# Patient Record
Sex: Female | Born: 1970 | Race: Black or African American | Hispanic: No | Marital: Married | Smoking: Never smoker
Health system: Southern US, Community
[De-identification: ages and names within clinical notes are randomized; demographics above are authoritative.]

## PROBLEM LIST (undated history)

## (undated) DIAGNOSIS — I10 Essential (primary) hypertension: Secondary | ICD-10-CM

## (undated) DIAGNOSIS — K219 Gastro-esophageal reflux disease without esophagitis: Secondary | ICD-10-CM

---

## 2007-01-16 ENCOUNTER — Ambulatory Visit: Payer: Self-pay | Admitting: Unknown Physician Specialty

## 2007-01-17 ENCOUNTER — Ambulatory Visit: Payer: Self-pay | Admitting: Unknown Physician Specialty

## 2007-10-21 ENCOUNTER — Ambulatory Visit: Payer: Self-pay | Admitting: Unknown Physician Specialty

## 2012-03-12 ENCOUNTER — Ambulatory Visit: Payer: Self-pay | Admitting: Internal Medicine

## 2013-03-14 ENCOUNTER — Ambulatory Visit: Payer: Self-pay | Admitting: Family Medicine

## 2013-12-30 DIAGNOSIS — R7301 Impaired fasting glucose: Secondary | ICD-10-CM | POA: Insufficient documentation

## 2013-12-30 DIAGNOSIS — K219 Gastro-esophageal reflux disease without esophagitis: Secondary | ICD-10-CM | POA: Insufficient documentation

## 2013-12-30 DIAGNOSIS — E785 Hyperlipidemia, unspecified: Secondary | ICD-10-CM | POA: Insufficient documentation

## 2014-03-16 ENCOUNTER — Ambulatory Visit: Payer: Self-pay | Admitting: Family Medicine

## 2016-02-21 DIAGNOSIS — Z713 Dietary counseling and surveillance: Secondary | ICD-10-CM | POA: Insufficient documentation

## 2016-02-21 DIAGNOSIS — K912 Postsurgical malabsorption, not elsewhere classified: Secondary | ICD-10-CM | POA: Insufficient documentation

## 2016-02-21 DIAGNOSIS — Z9884 Bariatric surgery status: Secondary | ICD-10-CM | POA: Insufficient documentation

## 2016-03-02 ENCOUNTER — Other Ambulatory Visit: Payer: Self-pay | Admitting: Family Medicine

## 2016-03-02 DIAGNOSIS — Z1231 Encounter for screening mammogram for malignant neoplasm of breast: Secondary | ICD-10-CM

## 2016-04-04 ENCOUNTER — Ambulatory Visit
Admission: RE | Admit: 2016-04-04 | Discharge: 2016-04-04 | Disposition: A | Discharge status: Home / Self Care | Payer: BC Managed Care – PPO | Source: Ambulatory Visit | Attending: Family Medicine | Admitting: Family Medicine

## 2016-04-04 ENCOUNTER — Encounter: Payer: Self-pay | Admitting: Radiology

## 2016-04-04 DIAGNOSIS — Z1231 Encounter for screening mammogram for malignant neoplasm of breast: Secondary | ICD-10-CM | POA: Diagnosis present

## 2017-07-03 ENCOUNTER — Other Ambulatory Visit: Payer: Self-pay | Admitting: Family Medicine

## 2017-07-03 DIAGNOSIS — Z1231 Encounter for screening mammogram for malignant neoplasm of breast: Secondary | ICD-10-CM

## 2017-07-04 ENCOUNTER — Ambulatory Visit
Admission: RE | Admit: 2017-07-04 | Discharge: 2017-07-04 | Disposition: A | Discharge status: Home / Self Care | Payer: BC Managed Care – PPO | Source: Ambulatory Visit | Attending: Family Medicine | Admitting: Family Medicine

## 2017-07-04 DIAGNOSIS — Z1231 Encounter for screening mammogram for malignant neoplasm of breast: Secondary | ICD-10-CM | POA: Diagnosis present

## 2018-08-29 ENCOUNTER — Ambulatory Visit
Admission: RE | Admit: 2018-08-29 | Discharge: 2018-08-29 | Disposition: A | Discharge status: Home / Self Care | Payer: BC Managed Care – PPO | Source: Ambulatory Visit | Attending: General Practice | Admitting: General Practice

## 2018-08-29 ENCOUNTER — Other Ambulatory Visit: Payer: Self-pay | Admitting: General Practice

## 2018-08-29 DIAGNOSIS — M545 Low back pain, unspecified: Secondary | ICD-10-CM

## 2018-10-21 ENCOUNTER — Other Ambulatory Visit: Payer: Self-pay | Admitting: Family Medicine

## 2018-10-21 DIAGNOSIS — Z1231 Encounter for screening mammogram for malignant neoplasm of breast: Secondary | ICD-10-CM

## 2018-10-29 ENCOUNTER — Other Ambulatory Visit: Payer: Self-pay | Admitting: *Deleted

## 2018-10-29 DIAGNOSIS — Z20822 Contact with and (suspected) exposure to covid-19: Secondary | ICD-10-CM

## 2018-11-04 LAB — NOVEL CORONAVIRUS, NAA: SARS-CoV-2, NAA: NOT DETECTED

## 2018-12-05 ENCOUNTER — Ambulatory Visit
Admission: RE | Admit: 2018-12-05 | Discharge: 2018-12-05 | Disposition: A | Discharge status: Home / Self Care | Payer: BC Managed Care – PPO | Source: Ambulatory Visit | Attending: Family Medicine | Admitting: Family Medicine

## 2018-12-05 ENCOUNTER — Encounter: Payer: Self-pay | Admitting: Radiology

## 2018-12-05 DIAGNOSIS — Z1231 Encounter for screening mammogram for malignant neoplasm of breast: Secondary | ICD-10-CM | POA: Diagnosis present

## 2019-01-21 HISTORY — PX: BACK SURGERY: SHX140

## 2019-02-07 ENCOUNTER — Other Ambulatory Visit: Payer: Self-pay

## 2019-02-07 ENCOUNTER — Ambulatory Visit: Payer: Self-pay

## 2019-02-07 DIAGNOSIS — Z23 Encounter for immunization: Secondary | ICD-10-CM

## 2019-06-22 ENCOUNTER — Ambulatory Visit: Payer: BC Managed Care – PPO | Attending: Internal Medicine

## 2019-06-22 DIAGNOSIS — Z23 Encounter for immunization: Secondary | ICD-10-CM | POA: Insufficient documentation

## 2019-06-22 NOTE — Progress Notes (Signed)
   Covid-19 Vaccination Clinic  Name:  Tomeshia Pizzi    MRN: [id number] DOB: [birth year]  06/22/2019  Ms. Jackquline Bosch was observed post Covid-19 immunization for 15 minutes without incidence. She was provided with Vaccine Information Sheet and instruction to access the V-Safe system.   Ms. Jackquline Bosch was instructed to call 911 with any severe reactions post vaccine: Marland Kitchen Difficulty breathing  . Swelling of your face and throat  . A fast heartbeat  . A bad rash all over your body  . Dizziness and weakness    Immunizations Administered    Name Date Dose VIS Date Route   Pfizer COVID-19 Vaccine 06/22/2019  2:44 PM 0.3 mL 04/04/2019 Intramuscular   Manufacturer: [employer], [employer]   Lot: [id number]   NDC: [address]-7

## 2019-07-16 ENCOUNTER — Ambulatory Visit: Payer: BC Managed Care – PPO | Attending: Internal Medicine

## 2019-07-16 DIAGNOSIS — Z23 Encounter for immunization: Secondary | ICD-10-CM

## 2019-07-16 NOTE — Progress Notes (Signed)
   Covid-19 Vaccination Clinic  Name:  Gudrun Axe    MRN: [id number] DOB: 12-May-1970  07/16/2019  Ms. Jackquline Bosch was observed post Covid-19 immunization for 15 minutes without incident. She was provided with Vaccine Information Sheet and instruction to access the V-Safe system.   Ms. Jackquline Bosch was instructed to call 911 with any severe reactions post vaccine: Marland Kitchen Difficulty breathing  . Swelling of face and throat  . A fast heartbeat  . A bad rash all over body  . Dizziness and weakness   Immunizations Administered    Name Date Dose VIS Date Route   Pfizer COVID-19 Vaccine 07/16/2019  1:52 PM 0.3 mL 04/04/2019 Intramuscular   Manufacturer: [employer], [employer]   Lot: [id number]   NDC: [address]-1

## 2019-09-12 ENCOUNTER — Emergency Department
Admission: EM | Admit: 2019-09-12 | Discharge: 2019-09-12 | Disposition: A | Discharge status: Home / Self Care | Payer: BC Managed Care – PPO | Attending: Emergency Medicine | Admitting: Emergency Medicine

## 2019-09-12 ENCOUNTER — Emergency Department: Payer: BC Managed Care – PPO

## 2019-09-12 DIAGNOSIS — M79605 Pain in left leg: Secondary | ICD-10-CM | POA: Insufficient documentation

## 2019-09-12 DIAGNOSIS — R110 Nausea: Secondary | ICD-10-CM | POA: Insufficient documentation

## 2019-09-12 DIAGNOSIS — R079 Chest pain, unspecified: Secondary | ICD-10-CM

## 2019-09-12 DIAGNOSIS — M79604 Pain in right leg: Secondary | ICD-10-CM | POA: Insufficient documentation

## 2019-09-12 DIAGNOSIS — R0789 Other chest pain: Secondary | ICD-10-CM | POA: Diagnosis not present

## 2019-09-12 DIAGNOSIS — Z9884 Bariatric surgery status: Secondary | ICD-10-CM | POA: Insufficient documentation

## 2019-09-12 LAB — CBC
HCT: 36.5 % (ref 36.0–46.0)
Hemoglobin: 13 g/dL (ref 12.0–15.0)
MCH: 31.6 pg (ref 26.0–34.0)
MCHC: 35.6 g/dL (ref 30.0–36.0)
MCV: 88.6 fL (ref 80.0–100.0)
Platelets: 258 10*3/uL (ref 150–400)
RBC: 4.12 MIL/uL (ref 3.87–5.11)
RDW: 12.8 % (ref 11.5–15.5)
WBC: 8.3 10*3/uL (ref 4.0–10.5)
nRBC: 0 % (ref 0.0–0.2)

## 2019-09-12 LAB — BASIC METABOLIC PANEL
Anion gap: 8 (ref 5–15)
BUN: 9 mg/dL (ref 6–20)
CO2: 29 mmol/L (ref 22–32)
Calcium: 8.6 mg/dL — ABNORMAL LOW (ref 8.9–10.3)
Chloride: 101 mmol/L (ref 98–111)
Creatinine, Ser: 0.69 mg/dL (ref 0.44–1.00)
GFR calc Af Amer: >60 mL/min (ref >60)
GFR calc non Af Amer: >60 mL/min (ref >60)
Glucose, Bld: 101 mg/dL — ABNORMAL HIGH (ref 70–99)
Potassium: 3.1 mmol/L — ABNORMAL LOW (ref 3.5–5.1)
Sodium: 138 mmol/L (ref 135–145)

## 2019-09-12 LAB — URINALYSIS, COMPLETE (UACMP) WITH MICROSCOPIC
Bacteria, UA: NONE SEEN
Bilirubin Urine: NEGATIVE
Glucose, UA: NEGATIVE mg/dL
Hgb urine dipstick: NEGATIVE
Ketones, ur: NEGATIVE mg/dL
Leukocytes,Ua: NEGATIVE
Nitrite: NEGATIVE
Protein, ur: NEGATIVE mg/dL
Specific Gravity, Urine: 1.003 — ABNORMAL LOW (ref 1.005–1.030)
Squamous Epithelial / HPF: NONE SEEN (ref 0–5)
pH: 6 (ref 5.0–8.0)

## 2019-09-12 LAB — POCT PREGNANCY, URINE: Preg Test, Ur: NEGATIVE

## 2019-09-12 LAB — TROPONIN I (HIGH SENSITIVITY)
Troponin I (High Sensitivity): <2 ng/L (ref <18)
Troponin I (High Sensitivity): <2 ng/L (ref <18)

## 2019-09-12 LAB — LIPASE, BLOOD: Lipase: 28 U/L (ref 11–51)

## 2019-09-12 MED ORDER — POTASSIUM CHLORIDE CRYS ER 20 MEQ PO TBCR
40.0000 meq | EXTENDED_RELEASE_TABLET | Freq: Once | ORAL | Status: AC
  Administered 2019-09-12: 40 meq via ORAL
  Filled 2019-09-12: qty 2

## 2019-09-12 MED ORDER — ONDANSETRON 4 MG PO TBDP
4.0000 mg | ORAL_TABLET | Freq: Three times a day (TID) | ORAL | 0 refills | Status: DC | PRN
Start: 2019-09-12 — End: 2019-11-03

## 2019-09-12 MED ORDER — ONDANSETRON 4 MG PO TBDP
4.0000 mg | ORAL_TABLET | Freq: Once | ORAL | Status: AC
  Administered 2019-09-12: 4 mg via ORAL
  Filled 2019-09-12: qty 1

## 2019-09-12 NOTE — ED Provider Notes (Signed)
[facility] Emergency Department Provider Note  ____________________________________________   First MD Initiated Contact with Patient 09/12/19 1905     (approximate)  I have reviewed the triage vital signs and the nursing notes.   HISTORY  Chief Complaint Chest Pain and Abdominal Pain    HPI Sharon Padilla is a 49 y.o. female who is otherwise healthy who comes in with multiple concerns.  #1 patient reports having butterflies in her stomach.  Patient reports she has had this for about a month now.  She had a gastric band done years ago but denies any recent complications.  She still able to eat denies any abdominal pain or vomiting.  She does state that she does have a little bit of nausea associated with it.  States that the symptoms are intermittent, nothing makes it better, nothing makes it worse  Patient also reports having some chest pain for about a week now but then today she developed some sharp stabbing pain on the left side of her chest so that made her more concerned so she went to make sure her heart looked good.  She denies any shortness of breath associated with it.  She denies any pain currently She describes occasionally feeling like the butterflies in her stomach caused palpitations and her heart.  Finally patient reports having some bilateral leg pain and cramping sensations.  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  She denies any of the symptoms that she is having as being new but she thought that they would get better after the surgery.  She denies any numbness or tingling, rectal numbness, weakness in the legs, urinating on herself.  Patient still able to ambulate normally          History reviewed. No pertinent past medical history.  There are no problems to display for this patient.   History reviewed. No pertinent surgical history.  Prior to Admission  medications   Not on File    Allergies Hydrocodone  Family History  Problem Relation Age of Onset  . Breast cancer Paternal Grandmother     Social History Social History   Tobacco Use  . Smoking status: Not on file  Substance Use Topics  . Alcohol use: Not on file  . Drug use: Not on file      Review of Systems Constitutional: No fever/chills Eyes: No visual changes. ENT: No sore throat. Cardiovascular: Positive chest pain Respiratory: Denies shortness of breath. Gastrointestinal: No abdominal pain.  Fluttering in abdomen, nausea.  No diarrhea.  No constipation. Genitourinary: Negative for dysuria. Musculoskeletal: Negative for back pain.  Leg pain Skin: Negative for rash. Neurological: Negative for headaches, focal weakness or numbness. All other ROS negative ____________________________________________   PHYSICAL EXAM:  VITAL SIGNS: ED Triage Vitals  Enc Vitals Group     BP 09/12/19 1641 118/73     Pulse Rate 09/12/19 1641 74     Resp 09/12/19 1641 18     Temp 09/12/19 1641 98.5 F (36.9 C)     Temp Source 09/12/19 1641 Oral     SpO2 09/12/19 1641 100 %     Weight 09/12/19 1638 190 lb (86.2 kg)     Height 09/12/19 1638 5\' 6"  (1.676 m)     Head Circumference --      Peak Flow --      Pain Score 09/12/19 1638 8     Pain Loc --  Pain Edu? --      Excl. in GC? --     Constitutional: Alert and oriented. Well appearing and in no acute distress. Eyes: Conjunctivae are normal. EOMI. Head: Atraumatic. Nose: No congestion/rhinnorhea. Mouth/Throat: Mucous membranes are moist.   Neck: No stridor. Trachea Midline. FROM Cardiovascular: Normal rate, regular rhythm. Grossly normal heart sounds.  Good peripheral circulation.  No chest wall tenderness Respiratory: Normal respiratory effort.  No retractions. Lungs CTAB. Gastrointestinal: Soft and nontender. No distention. No abdominal bruits.  Musculoskeletal: No lower extremity tenderness nor edema.  No joint  effusions. Neurologic:  Normal speech and language.  Equal strength in bilateral legs.  Sensation intact bilaterally.  Cranial nerves II to XII are intact.  Ambulate patient she has a normal gait.  Well-healing midline back scar Skin:  Skin is warm, dry and intact. No rash noted. Psychiatric: Mood and affect are normal. Speech and behavior are normal. GU: Deferred   ____________________________________________   LABS (all labs ordered are listed, but only abnormal results are displayed)  Labs Reviewed  BASIC METABOLIC PANEL - Abnormal; Notable for the following components:      Result Value   Potassium 3.1 (*)    Glucose, Bld 101 (*)    Calcium 8.6 (*)    All other components within normal limits  URINALYSIS, COMPLETE (UACMP) WITH MICROSCOPIC - Abnormal; Notable for the following components:   Color, Urine STRAW (*)    APPearance CLEAR (*)    Specific Gravity, Urine 1.003 (*)    All other components within normal limits  CBC  LIPASE, BLOOD  TROPONIN I (HIGH SENSITIVITY)  TROPONIN I (HIGH SENSITIVITY)   ____________________________________________   ED ECG REPORT I, Concha Se, the attending physician, personally viewed and interpreted this ECG.  EKG normal sinus rate of 74, no ST elevation, T wave version V3, normal intervals ____________________________________________  RADIOLOGY Vela Prose, personally viewed and evaluated these images (plain radiographs) as part of my medical decision making, as well as reviewing the written report by the radiologist.  ED MD interpretation: No pneumonia  Official radiology report(s): DG Chest 2 View  Result Date: 09/12/2019 CLINICAL DATA:  Nausea, chest pain EXAM: CHEST - 2 VIEW COMPARISON:  None. FINDINGS: Frontal and lateral views of the chest demonstrate an unremarkable cardiac silhouette. No airspace disease, effusion, or pneumothorax. No acute bony abnormalities. IMPRESSION: 1. No acute intrathoracic process. Electronically  Signed   By: Sharlet Salina M.D.   On: 09/12/2019 17:22    ____________________________________________   PROCEDURES  Procedure(s) performed (including Critical Care):  Procedures   ____________________________________________   INITIAL IMPRESSION / ASSESSMENT AND PLAN / ED COURSE   Sharon Padilla was evaluated in Emergency Department on 09/12/2019 for the symptoms described in the history of present illness. She was evaluated in the context of the global COVID-19 pandemic, which necessitated consideration that the patient might be at risk for infection with the SARS-CoV-2 virus that causes COVID-19. Institutional protocols and algorithms that pertain to the evaluation of patients at risk for COVID-19 are in a state of rapid change based on information released by regulatory bodies including the CDC and federal and state organizations. These policies and algorithms were followed during the patient's care in the ED.    For patient's butterflies in her stomach and nausea.  Her abdomen is soft and nontender she has no signs of acute abdominal process such as cholecystitis, perforation, obstruction.  We discussed CT imaging but given she is having no  pain or vomiting I think we can hold off.  Discussed trialing some Zofran.  For patient's chest pain will get cardiac markers to rule out ACS.  No shortness of breath and low suspicion for PE.  The pain is not pleuritic in nature.  Will give patient follow-up with cardiology given reports of palpitations to discuss Holter monitor.  For the leg pain and cramping sensation she is neuro intact.  No evidence of cord compression.  These have been going on for over a year.  Discussed with patient she should follow-up with her surgeon.  DDx that was also considered d/t potential to cause harm, but was found less likely based on history and physical (as detailed above): -PNA (no fevers, cough but CXR to evaluate) -PNX (reassured with equal b/l  breath sounds, CXR to evaluate) -Symptomatic anemia (will get H&H) -Pulmonary embolism as no sob at rest, not pleuritic in nature, no hypoxia -Aortic Dissection as no tearing pain and no radiation to the mid back, pulses equal -Pericarditis no rub on exam, EKG changes or hx to suggest dx -Tamponade (no notable SOB, tachycardic, hypotensive) -Esophageal rupture (no h/o diffuse vomitting/no crepitus)   Patient's labs are reassuring x-ray was negative  Potassium slightly low.  We will give some oral repletion.  Cardiac markers negative x2.  Recommended giving some Zofran and p.o. challenge the patient states she would really like to just leave now.  Patient has a really reassuring exam and feels comfortable following up as described above.  She understands to return to ER if her symptoms are worsening patient understand that she can restart her PPI in case this could be related to some acid reflux      ____________________________________________   FINAL CLINICAL IMPRESSION(S) / ED DIAGNOSES   Final diagnoses:  Nausea  Chest pain, unspecified type     MEDICATIONS GIVEN DURING THIS VISIT:  Medications  ondansetron (ZOFRAN-ODT) disintegrating tablet 4 mg (has no administration in time range)  potassium chloride SA (KLOR-CON) CR tablet 40 mEq (has no administration in time range)     ED Discharge Orders    None       Note:  This document was prepared using Dragon voice recognition software and may include unintentional dictation errors.   Vanessa [place], MD 09/12/19 2017

## 2019-09-12 NOTE — ED Triage Notes (Signed)
Pt comes POV with "butterflies" in her stomach that won't go away. Pt states it made her nauseous and made her chest start hurting. Pt had surgery on spine for cyst removal recently but states that her legs still hurt intermittently with numbness. Pt restless and appears anxious.

## 2019-09-12 NOTE — Discharge Instructions (Signed)
For your leg pain you should follow this up with your orthopedic doctor.  For your nausea and your butterflies in the stomach.  We could try you on some Zofran.  You should return to ER if develop abdominal pain or vomiting please at that time we may not want to do CT imaging.  You should restart taking your acid reducer PPI  For your chest pain this could be related to your acid reflux to restart your acid reducer.  Given you are having some palpitations associated with it you should follow-up with cardiology.  You should call them to make an appointment.  Return to the ER if you develop worsening chest pain or any other concerns

## 2019-11-03 ENCOUNTER — Ambulatory Visit: Payer: BC Managed Care – PPO | Admitting: Emergency Medicine

## 2019-11-03 ENCOUNTER — Encounter: Payer: Self-pay | Admitting: Emergency Medicine

## 2019-11-03 ENCOUNTER — Other Ambulatory Visit: Payer: Self-pay

## 2019-11-03 VITALS — BP 122/84 | HR 88 | Temp 98.8°F | Resp 16 | Ht 67.0 in | Wt 180.0 lb

## 2019-11-03 DIAGNOSIS — H6983 Other specified disorders of Eustachian tube, bilateral: Secondary | ICD-10-CM

## 2019-11-03 MED ORDER — AZITHROMYCIN 250 MG PO TABS
ORAL_TABLET | ORAL | 0 refills | Status: DC
Start: 2019-11-03 — End: 2019-11-27

## 2019-11-03 MED ORDER — OXYMETAZOLINE HCL 0.05 % NA SOLN: 1.0000 | Freq: Once | NASAL | Status: AC

## 2019-11-03 MED ORDER — BENZONATATE 200 MG PO CAPS
200.0000 mg | ORAL_CAPSULE | Freq: Three times a day (TID) | ORAL | 1 refills | Status: DC | PRN
Start: 2019-11-03 — End: 2019-11-27

## 2019-11-03 MED ORDER — PREDNISONE 10 MG PO TABS: ORAL_TABLET | ORAL | 0 refills | Status: DC

## 2019-11-03 MED ORDER — OMEPRAZOLE MAGNESIUM 20 MG PO TBEC: 20.0000 mg | DELAYED_RELEASE_TABLET | Freq: Every day | ORAL | 2 refills | Status: DC

## 2019-11-03 NOTE — Progress Notes (Signed)
I have reviewed the triage vital signs and the nursing notes.   HISTORY  Chief Complaint Cough  HPI Sharon Padilla is a 49 y.o. female presents with complaint of nonproductive cough for [redacted] weeks along with bilateral ear pain.  Patient denies any fever or chills.  There is been no loss of taste or smell.  Patient initially thought this was due to allergies.  She is concerned as she has a trip planned to go to [place].  She reports that she is unable to get her ears to open which multiples the sound.  Currently she is using Flonase without results.      No past medical history on file.  Patient Active Problem List   Diagnosis Date Noted  . Dietary counseling 02/21/2016  . Hx of laparoscopic gastric banding 02/21/2016  . 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 GERD (gastroesophageal reflux disease) 12/30/2013  . Impaired fasting glucose 12/30/2013  . Other and unspecified hyperlipidemia 12/30/2013    Past Surgical History:  Procedure Laterality Date  . BACK SURGERY  01/21/2019    Prior to Admission medications   Medication Sig Start Date End Date Taking? Authorizing Provider  ACETAMINOPHEN EXTRA STRENGTH 500 MG tablet  07/24/19  Yes [provider]  calcium citrate-vitamin D (CITRACAL+D) 315-200 MG-UNIT tablet Take 1 tablet by mouth daily.   Yes [provider]  fluticasone (FLONASE) 50 MCG/ACT nasal spray Place 2 sprays into both nostrils daily. 08/30/19  Yes [provider]  LORazepam (ATIVAN) 0.5 MG tablet TAKE 1 TABLET(0.5 MG) BY MOUTH EVERY DAY AS NEEDED 10/13/19  Yes [provider]  losartan-hydrochlorothiazide (HYZAAR) 50-12.5 MG tablet Take 1 tablet by mouth as needed. 10/07/18  Yes [provider]  Omega-3 Fat Ac-Cholecalciferol (OCEAN BLUE MINICAPS D/OMEGA3) [phone] MG-UNIT CAPS Take by mouth.   Yes [provider]  omeprazole (PRILOSEC OTC) 20 MG tablet Take 1 tablet (20 mg total) by mouth daily. 11/03/19  Yes  Tommi Rumps, PA-C  valACYclovir (VALTREX) 1000 MG tablet Take 1 tablet by mouth daily as needed. 03/25/19  Yes [provider]  azithromycin (ZITHROMAX) 250 MG tablet As directed 11/03/19   Tommi Rumps, PA-C  benzonatate (TESSALON) 200 MG capsule Take 1 capsule (200 mg total) by mouth every 8 (eight) hours as needed for cough. 11/03/19   Tommi Rumps, PA-C  predniSONE (DELTASONE) 10 MG tablet Take 6 tablets  today, on day 2 take 5 tablets, day 3 take 4 tablets, day 4 take 3 tablets, day 5 take  2 tablets and 1 tablet the last day 11/03/19   Tommi Rumps, PA-C    Allergies Oxycodone  Family History  Problem Relation Age of Onset  . Breast cancer Paternal Grandmother     Social History Social History   Tobacco Use  . Smoking status: Never Smoker  . Smokeless tobacco: Never Used  Substance Use Topics  . Alcohol use: Yes  . Drug use: Never    Review of Systems Constitutional: No fever/chills Eyes: No visual changes. ENT: No sore throat.  Positive ear pain. Cardiovascular: Denies chest pain. Respiratory: Denies shortness of breath.  Positive nonproductive cough. Gastrointestinal: No abdominal pain.  No nausea, no vomiting.  No diarrhea.  Musculoskeletal: Negative for muscle aches. Skin: Negative for rash. Neurological: Negative for headaches, focal weakness or numbness. ____________________________________________   PHYSICAL EXAM: Constitutional: Alert and oriented. Well appearing and in no acute distress.  Dry cough is noted during exam. Eyes: Conjunctivae are normal. PERRL.  EOMI. Head: Atraumatic. Nose: No congestion/rhinnorhea.  EACs are clear.  TMs are dull with poor light reflex.  No erythema or injection is noted. Mouth/Throat: Mucous membranes are moist.  Oropharynx non-erythematous. Neck: No stridor.   Cardiovascular: Normal rate, regular rhythm. Grossly normal heart sounds.  Good peripheral circulation. Respiratory: Normal respiratory  effort.  No retractions. Lungs CTAB. Musculoskeletal: Moves upper and lower extremities without any difficulty.  Normal gait was noted. Neurologic:  Normal speech and language. No gross focal neurologic deficits are appreciated. No gait instability. Skin:  Skin is warm, dry and intact. No rash noted. Psychiatric: Mood and affect are normal. Speech and behavior are normal.  ____________________________________________   LABS (all labs ordered are listed, but only abnormal results are displayed)  No results found.  ____________________________________________   FINAL CLINICAL IMPRESSION(S)   49 year old female presents to the office with complaint of cough for approximately 3 weeks.  There is been no fever or chills.  She denies any symptoms of Covid.  She also complains of ear pain with decreased hearing in each.  On exam findings are consistent with eustachian tube dysfunction with upper respiratory infection.  Because patient will be flying to [facility] we discussed eustachian tube dysfunction.  She will continue using the Flonase that she currently has.  A prescription for prednisone tapering dose and Afrin to be used 30 minutes prior to landing should give her relief of her ear pain.  Also a Z-Pak was prescribed and a refill on her Prilosec was sent to the pharmacy.   ED Discharge Orders         Ordered    OXYMETAZOLINE HCL 0.05 % NA SOLN   Once     Discontinue  Reprint     11/03/19 1424    omeprazole (PRILOSEC OTC) 20 MG tablet  Daily     Discontinue  Reprint     11/03/19 1424    predniSONE (DELTASONE) 10 MG tablet     Discontinue  Reprint     11/03/19 1424    benzonatate (TESSALON) 200 MG capsule  Every 8 hours PRN     Discontinue  Reprint     11/03/19 1424    azithromycin (ZITHROMAX) 250 MG tablet     Discontinue  Reprint     11/03/19 1424           Note:  This document was prepared using Dragon voice recognition software and may include unintentional dictation errors.

## 2019-11-03 NOTE — Progress Notes (Signed)
Pt describes her cough as dry and constant for a couple a weeks. Pt states her throat is starting to get sore but it feels more itchy then anything. Now the left ear is hurting the right ear feels clogged to where it feels like they  Need to be popped. Pt ear started over the past weeknd Friday 7/9. CL,RMA

## 2019-11-26 ENCOUNTER — Encounter: Payer: Self-pay | Admitting: Emergency Medicine

## 2019-11-26 ENCOUNTER — Other Ambulatory Visit: Payer: Self-pay

## 2019-11-26 ENCOUNTER — Ambulatory Visit: Payer: Self-pay | Admitting: Emergency Medicine

## 2019-11-26 VITALS — BP 122/80 | HR 78 | Temp 98.6°F | Resp 16 | Ht 67.0 in

## 2019-11-26 DIAGNOSIS — J020 Streptococcal pharyngitis: Secondary | ICD-10-CM

## 2019-11-26 DIAGNOSIS — R059 Cough, unspecified: Secondary | ICD-10-CM

## 2019-11-26 MED ORDER — CETIRIZINE HCL 10 MG PO TABS: 10.0000 mg | ORAL_TABLET | Freq: Every day | ORAL | 11 refills | Status: AC

## 2019-11-26 MED ORDER — AMOXICILLIN 500 MG PO CAPS
500.0000 mg | ORAL_CAPSULE | Freq: Three times a day (TID) | ORAL | 0 refills | Status: DC
Start: 2019-11-26 — End: 2022-05-02

## 2019-11-26 NOTE — Progress Notes (Signed)
Occupational Health Provider Note       Time seen: 9:15 AM    I have reviewed the vital signs and the nursing notes.  HISTORY   Chief Complaint Follow-up (URI)    HPI Sharon Padilla is a 49 y.o. female with a history of lap band procedure who presents today for thick mucus in her throat.  Patient states she has to cough it up, it seems to be yellow.  She was seen 3 weeks ago and antibiotics and steroids seem to help her sinuses.  She has her symptoms persistently.  Denies any recent changes.  No past medical history on file.  Past Surgical History:  Procedure Laterality Date  . BACK SURGERY  01/21/2019    Allergies Oxycodone   Review of Systems Constitutional: Negative for fever. HEENT exam: Positive for congestion, mucus Cardiovascular: Negative for chest pain. Respiratory: Negative for shortness of breath.  Positive for persistent cough Gastrointestinal: Negative for abdominal pain, vomiting and diarrhea. Musculoskeletal: Negative for back pain. Skin: Negative for rash. Neurological: Negative for headaches, focal weakness or numbness.  All systems negative/normal/unremarkable except as stated in the HPI  ____________________________________________   PHYSICAL EXAM:  VITAL SIGNS: Vitals:   11/26/19 0849  BP: 122/80  Pulse: 78  Resp: 16  Temp: 98.6 F (37 C)  SpO2: 100%    Constitutional: Alert and oriented. Well appearing and in no distress. Eyes: Conjunctivae are normal. Normal extraocular movements. ENT      Head: Normocephalic and atraumatic.      Nose: No congestion/rhinnorhea.      Mouth/Throat: Mucous membranes are moist.  No erythema      Neck: No stridor.  No adenopathy Cardiovascular: Normal rate, regular rhythm. No murmurs, rubs, or gallops. Respiratory: Normal respiratory effort without tachypnea nor retractions. Breath sounds are clear and equal bilaterally. No wheezes/rales/rhonchi. Musculoskeletal: Nontender with normal  range of motion in extremities. No lower extremity tenderness nor edema. Neurologic:  Normal speech and language. No gross focal neurologic deficits are appreciated.  Skin:  Skin is warm, dry and intact. No rash noted.  ____________________________________________   LABS (pertinent positives/negatives)  Recent Results (from the past 2160 hour(s))  Basic metabolic panel     Status: Abnormal   Collection Time: 09/12/19  4:41 PM  Result Value Ref Range   Sodium 138 135 - 145 mmol/L   Potassium 3.1 (L) 3.5 - 5.1 mmol/L   Chloride 101 98 - 111 mmol/L   CO2 29 22 - 32 mmol/L   Glucose, Bld 101 (H) 70 - 99 mg/dL    Comment: Glucose reference range applies only to samples taken after fasting for at least 8 hours.   BUN 9 6 - 20 mg/dL   Creatinine, Ser 2.68 0.44 - 1.00 mg/dL   Calcium 8.6 (L) 8.9 - 10.3 mg/dL   GFR calc non Af Amer >60 >60 mL/min   GFR calc Af Amer >60 >60 mL/min   Anion gap 8 5 - 15    Comment: Performed at [facility], [address] [place], [place] [address]  CBC     Status: None   Collection Time: 09/12/19  4:41 PM  Result Value Ref Range   WBC 8.3 4.0 - 10.5 K/uL   RBC 4.12 3.87 - 5.11 MIL/uL   Hemoglobin 13.0 12.0 - 15.0 g/dL   HCT 22.2 36 - 46 %   MCV 88.6 80.0 - 100.0 fL   MCH 31.6 26.0 - 34.0 pg   MCHC 35.6  30.0 - 36.0 g/dL   RDW 74.1 28.7 - 86.7 %   Platelets 258 150 - 400 K/uL   nRBC 0.0 0.0 - 0.2 %    Comment: Performed at [facility], [address] [place], [place] [address]  Troponin I (High Sensitivity)     Status: None   Collection Time: 09/12/19  4:41 PM  Result Value Ref Range   Troponin I (High Sensitivity) <2 <18 ng/L    Comment: (NOTE) Elevated high sensitivity troponin I (hsTnI) values and significant  changes across serial measurements may suggest ACS but many other  chronic and acute conditions are known to elevate hsTnI results.  Refer to the "Links" section for chest pain algorithms and additional   guidance. Performed at [facility], [address] [place], [place] [address]   Lipase, blood     Status: None   Collection Time: 09/12/19  4:41 PM  Result Value Ref Range   Lipase 28 11 - 51 U/L    Comment: Performed at [facility], [address] [place], [place] [address]  Urinalysis, Complete w Microscopic     Status: Abnormal   Collection Time: 09/12/19  4:42 PM  Result Value Ref Range   Color, Urine STRAW (A) YELLOW   APPearance CLEAR (A) CLEAR   Specific Gravity, Urine 1.003 (L) 1.005 - 1.030   pH 6.0 5.0 - 8.0   Glucose, UA NEGATIVE NEGATIVE mg/dL   Hgb urine dipstick NEGATIVE NEGATIVE   Bilirubin Urine NEGATIVE NEGATIVE   Ketones, ur NEGATIVE NEGATIVE mg/dL   Protein, ur NEGATIVE NEGATIVE mg/dL   Nitrite NEGATIVE NEGATIVE   Leukocytes,Ua NEGATIVE NEGATIVE   RBC / HPF 0-5 0 - 5 RBC/hpf   WBC, UA 0-5 0 - 5 WBC/hpf   Bacteria, UA NONE SEEN NONE SEEN   Squamous Epithelial / LPF NONE SEEN 0 - 5    Comment: Performed at [facility], [address], [place], [place] [address]  Pregnancy, urine POC     Status: None   Collection Time: 09/12/19  4:53 PM  Result Value Ref Range   Preg Test, Ur NEGATIVE NEGATIVE    Comment:        THE SENSITIVITY OF THIS METHODOLOGY IS >24 mIU/mL   Troponin I (High Sensitivity)     Status: None   Collection Time: 09/12/19  6:51 PM  Result Value Ref Range   Troponin I (High Sensitivity) <2 <18 ng/L    Comment: (NOTE) Elevated high sensitivity troponin I (hsTnI) values and significant  changes across serial measurements may suggest ACS but many other  chronic and acute conditions are known to elevate hsTnI results.  Refer to the "Links" section for chest pain algorithms and additional  guidance. Performed at [facility], [address] [place], [place] [address]      ASSESSMENT AND PLAN  Congestion   Plan: The patient had presented for persistent congestion and cough.  Examination is  unremarkable.  Unclear etiology, likely seasonal allergy.  I will place her on Zyrtec and amoxicillin.  She will be referred to ENT for what seems to be months of mucus production and irritation.  Daryel November MD    Note: This note was generated in part or whole with voice recognition software. Voice recognition is usually quite accurate but there are transcription errors that can and very often do occur. I apologize for any typographical errors that were not detected and corrected.

## 2019-11-26 NOTE — Progress Notes (Signed)
Pt states she still having thick mucous sit in her chest throat. The antibiotics and steroids helped with her sinuses. CL,RMA

## 2019-11-27 ENCOUNTER — Other Ambulatory Visit: Payer: Self-pay

## 2019-11-27 ENCOUNTER — Encounter: Payer: Self-pay | Admitting: Emergency Medicine

## 2019-11-27 ENCOUNTER — Emergency Department
Admission: EM | Admit: 2019-11-27 | Discharge: 2019-11-27 | Disposition: A | Discharge status: Home / Self Care | Payer: No Typology Code available for payment source | Attending: Student in an Organized Health Care Education/Training Program | Admitting: Student in an Organized Health Care Education/Training Program

## 2019-11-27 ENCOUNTER — Emergency Department: Payer: No Typology Code available for payment source

## 2019-11-27 DIAGNOSIS — Y9351 Activity, roller skating (inline) and skateboarding: Secondary | ICD-10-CM | POA: Diagnosis not present

## 2019-11-27 DIAGNOSIS — S52501A Unspecified fracture of the lower end of right radius, initial encounter for closed fracture: Secondary | ICD-10-CM | POA: Diagnosis not present

## 2019-11-27 DIAGNOSIS — Y9289 Other specified places as the place of occurrence of the external cause: Secondary | ICD-10-CM | POA: Diagnosis not present

## 2019-11-27 DIAGNOSIS — I10 Essential (primary) hypertension: Secondary | ICD-10-CM | POA: Insufficient documentation

## 2019-11-27 DIAGNOSIS — Y998 Other external cause status: Secondary | ICD-10-CM | POA: Insufficient documentation

## 2019-11-27 DIAGNOSIS — S52591A Other fractures of lower end of right radius, initial encounter for closed fracture: Secondary | ICD-10-CM

## 2019-11-27 DIAGNOSIS — Z79899 Other long term (current) drug therapy: Secondary | ICD-10-CM | POA: Insufficient documentation

## 2019-11-27 DIAGNOSIS — S6991XA Unspecified injury of right wrist, hand and finger(s), initial encounter: Secondary | ICD-10-CM | POA: Diagnosis present

## 2019-11-27 HISTORY — DX: Essential (primary) hypertension: I10

## 2019-11-27 HISTORY — DX: Gastro-esophageal reflux disease without esophagitis: K21.9

## 2019-11-27 MED ORDER — HYDROCODONE-ACETAMINOPHEN 5-325 MG PO TABS: 1.0000 | ORAL_TABLET | Freq: Four times a day (QID) | ORAL | 0 refills | Status: DC | PRN

## 2019-11-27 MED ORDER — HYDROCODONE-ACETAMINOPHEN 5-325 MG PO TABS
1.0000 | ORAL_TABLET | Freq: Once | ORAL | Status: AC
  Administered 2019-11-27: 1 via ORAL
  Filled 2019-11-27: qty 1

## 2019-11-27 NOTE — Discharge Instructions (Addendum)
Call make an appointment with Dr. [place] Clan who is on-call for orthopedics today.  He was seen in the office next week.  Ice and elevate frequently to reduce swelling.  Wear splint until seen by orthopedist.  Wear the sling when up walking to help elevate your wrist.  You do not have to wear this to bed but leave the splint on.  Hydrocodone-acetaminophen was sent to your pharmacy to take every 6 hours as needed for pain.  Return to the emergency department over the weekend if any severe worsening of your wrist or urgent concerns.

## 2019-11-27 NOTE — ED Triage Notes (Signed)
Pt in via POV, reports skating accident, getting pulled down by one of children, injurying right wrist.  Swelling noted, ice pack provided.

## 2019-11-27 NOTE — ED Provider Notes (Signed)
[facility] Emergency Department Provider Note   ____________________________________________   First MD Initiated Contact with Patient 11/27/19 1312     (approximate)  I have reviewed the triage vital signs and the nursing notes.   HISTORY  Chief Complaint Wrist Injury    HPI [place] Padilla is a 49 y.o. female presents to the ED after injuring her wrist.  Patient states that she was wearing skates and one child brushed by her causing her to lose her balance and fall.  She has continued to have pain with swelling in her right wrist.  She denies any head injury or loss of consciousness.  Currently she rates her pain as an 8 out of 10.     Past Medical History:  Diagnosis Date  . Acid reflux disease   . Hypertension     Patient Active Problem List   Diagnosis Date Noted  . Dietary counseling 02/21/2016  . Hx of laparoscopic gastric banding 02/21/2016  . 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 GERD (gastroesophageal reflux disease) 12/30/2013  . Impaired fasting glucose 12/30/2013  . Other and unspecified hyperlipidemia 12/30/2013    Past Surgical History:  Procedure Laterality Date  . BACK SURGERY  01/21/2019    Prior to Admission medications   Medication Sig Start Date End Date Taking? Authorizing Provider  ACETAMINOPHEN EXTRA STRENGTH 500 MG tablet  07/24/19   [provider]  amoxicillin (AMOXIL) 500 MG capsule Take 1 capsule (500 mg total) by mouth 3 (three) times daily. 11/26/19   Emily Filbert, MD  calcium citrate-vitamin D (CITRACAL+D) 315-200 MG-UNIT tablet Take 1 tablet by mouth daily.    [provider]  cetirizine (ZYRTEC) 10 MG tablet Take 1 tablet (10 mg total) by mouth daily. 11/26/19   Emily Filbert, MD  fluticasone (FLONASE) 50 MCG/ACT nasal spray Place 2 sprays into both nostrils daily. 08/30/19   [provider]  HYDROcodone-acetaminophen (NORCO/VICODIN) 5-325 MG tablet Take 1  tablet by mouth every 6 (six) hours as needed for moderate pain. 11/27/19   Tommi Rumps, PA-C  LORazepam (ATIVAN) 0.5 MG tablet TAKE 1 TABLET(0.5 MG) BY MOUTH EVERY DAY AS NEEDED 10/13/19   [provider]  losartan-hydrochlorothiazide (HYZAAR) 50-12.5 MG tablet Take 1 tablet by mouth as needed. 10/07/18   [provider]  meloxicam (MOBIC) 15 MG tablet Take 15 mg by mouth daily. 11/08/19   [provider]  Omega-3 Fat Ac-Cholecalciferol (OCEAN BLUE MINICAPS D/OMEGA3) [id number] MG-UNIT CAPS Take by mouth.    [provider]  omeprazole (PRILOSEC OTC) 20 MG tablet Take 1 tablet (20 mg total) by mouth daily. 11/03/19   Tommi Rumps, PA-C  omeprazole (PRILOSEC) 20 MG capsule Take 20 mg by mouth daily. 11/03/19   [provider]  valACYclovir (VALTREX) 1000 MG tablet Take 1 tablet by mouth daily as needed. 03/25/19   [provider]    Allergies Oxycodone  Family History  Problem Relation Age of Onset  . Breast cancer Paternal Grandmother     Social History Social History   Tobacco Use  . Smoking status: Never Smoker  . Smokeless tobacco: Never Used  Vaping Use  . Vaping Use: Never used  Substance Use Topics  . Alcohol use: Yes  . Drug use: Never    Review of Systems Constitutional: No fever/chills Eyes: No visual changes. ENT: No trauma. Cardiovascular: Denies chest pain. Respiratory: Denies shortness of breath. Gastrointestinal:   No nausea, no vomiting.  Musculoskeletal: Positive for  right wrist pain. Skin: Negative for injury. Neurological: Negative for headaches, focal weakness or numbness.  ____________________________________________   PHYSICAL EXAM:  VITAL SIGNS: ED Triage Vitals  Enc Vitals Group     BP 11/27/19 1120 140/79     Pulse Rate 11/27/19 1120 61     Resp 11/27/19 1120 17     Temp 11/27/19 1120 98.4 F (36.9 C)     Temp Source 11/27/19 1120 Oral     SpO2 11/27/19 1120 100 %     Weight  11/27/19 1122 175 lb (79.4 kg)     Height 11/27/19 1122 5\' 7"  (1.702 m)     Head Circumference --      Peak Flow --      Pain Score 11/27/19 1124 8     Pain Loc --      Pain Edu? --      Excl. in GC? --     Constitutional: Alert and oriented. Well appearing and in no acute distress. Eyes: Conjunctivae are normal. PERRL. EOMI. Head: Atraumatic. Neck: No stridor.   Cardiovascular: Normal rate, regular rhythm. Grossly normal heart sounds.  Good peripheral circulation. Respiratory: Normal respiratory effort.  No retractions. Lungs CTAB. Gastrointestinal: Soft and nontender.  Musculoskeletal: On examination of the right wrist there is moderate soft tissue edema and tenderness noted.  Pulses present.  Motor sensory function intact and patient is able to move all digits without any difficulty or restriction.  No tenderness is noted on palpation of the forearm.  Skin is warm and dry.  Capillary refill is difficult to assess due to fingernails. Neurologic:  Normal speech and language. No gross focal neurologic deficits are appreciated.  Skin:  Skin is warm, dry and intact.  Psychiatric: Mood and affect are normal. Speech and behavior are normal.  ____________________________________________   LABS (all labs ordered are listed, but only abnormal results are displayed)  Labs Reviewed - No data to display ____________________________________________  RADIOLOGY   Official radiology report(s): DG Wrist Complete Right  Result Date: 11/27/2019 CLINICAL DATA:  Right wrist pain after fall. EXAM: RIGHT WRIST - COMPLETE 3+ VIEW COMPARISON:  None. FINDINGS: Moderately displaced and comminuted fracture is seen involving the distal right radius. Joint spaces are intact. No soft tissue abnormality is noted. IMPRESSION: Moderately displaced and comminuted distal right radial fracture. Electronically Signed   By: 01/27/2020 M.D.   On: 11/27/2019 12:16     ____________________________________________   PROCEDURES  Procedure(s) performed (including Critical Care):  Procedures Sugar tong splint and sling was applied by ED tech.   ____________________________________________   INITIAL IMPRESSION / ASSESSMENT AND PLAN / ED COURSE  As part of my medical decision making, I reviewed the following data within the electronic MEDICAL RECORD NUMBER Notes from prior ED visits and [place] Controlled Substance Database  Sharon Padilla 01/27/2020 was evaluated in Emergency Department on 11/27/2019 for the symptoms described in the history of present illness. She was evaluated in the context of the global COVID-19 pandemic, which necessitated consideration that the patient might be at risk for infection with the SARS-CoV-2 virus that causes COVID-19. Institutional protocols and algorithms that pertain to the evaluation of patients at risk for COVID-19 are in a state of rapid change based on information released by regulatory bodies including the CDC and federal and state organizations. These policies and algorithms were followed during the patient's care in the ED.  49 year old female presents to the ED after falling and injuring her right wrist.  Patient  states that at the time she was wearing a pair of roller skates.  She denies any head injury or loss of consciousness and reports that the only thing hurting is her wrist.  On exam there is moderate swelling and tenderness and highly suspicious for fracture.  X-rays showed that there was a comminuted displaced fracture of the distal radius.  Patient was placed in a sugar tong splint and sling.  She was given instructions to ice elevate frequently to reduce swelling.  She is to follow-up with Dr. [place] Clan who is the orthopedist on-call.  She was given hydrocodone while in the ED and a prescription for the same was sent to her pharmacy.  She return to the ED over the weekend if there is any severe worsening or urgent  concerns.    ____________________________________________   FINAL CLINICAL IMPRESSION(S) / ED DIAGNOSES  Final diagnoses:  Other closed fracture of distal end of right radius, initial encounter     ED Discharge Orders         Ordered    HYDROcodone-acetaminophen (NORCO/VICODIN) 5-325 MG tablet  Every 6 hours PRN     Discontinue  Reprint     11/27/19 1417           Note:  This document was prepared using Dragon voice recognition software and may include unintentional dictation errors.    Tommi Rumps, PA-C 11/27/19 1428    Willy Eddy, MD 11/27/19 1430

## 2019-12-16 ENCOUNTER — Other Ambulatory Visit: Payer: Self-pay | Admitting: Family Medicine

## 2019-12-17 ENCOUNTER — Other Ambulatory Visit: Payer: Self-pay | Admitting: Family Medicine

## 2019-12-17 ENCOUNTER — Telehealth: Payer: Self-pay | Admitting: Emergency Medicine

## 2019-12-17 DIAGNOSIS — M25551 Pain in right hip: Secondary | ICD-10-CM

## 2019-12-17 DIAGNOSIS — Z1231 Encounter for screening mammogram for malignant neoplasm of breast: Secondary | ICD-10-CM

## 2019-12-17 NOTE — Telephone Encounter (Signed)
Entered in error

## 2019-12-17 NOTE — Telephone Encounter (Signed)
Called in [place] for pelvis, recent fall. Possible constipation, encouraged fluids and colace

## 2019-12-19 ENCOUNTER — Other Ambulatory Visit: Payer: Self-pay

## 2019-12-19 ENCOUNTER — Ambulatory Visit
Admission: RE | Admit: 2019-12-19 | Discharge: 2019-12-19 | Disposition: A | Discharge status: Home / Self Care | Payer: BC Managed Care – PPO | Attending: Emergency Medicine | Admitting: Emergency Medicine

## 2019-12-19 ENCOUNTER — Ambulatory Visit
Admission: RE | Admit: 2019-12-19 | Discharge: 2019-12-19 | Disposition: A | Discharge status: Home / Self Care | Payer: BC Managed Care – PPO | Source: Ambulatory Visit | Attending: Emergency Medicine | Admitting: Emergency Medicine

## 2019-12-19 DIAGNOSIS — M25551 Pain in right hip: Secondary | ICD-10-CM

## 2020-01-05 ENCOUNTER — Ambulatory Visit
Admission: RE | Admit: 2020-01-05 | Discharge: 2020-01-05 | Disposition: A | Discharge status: Home / Self Care | Payer: BC Managed Care – PPO | Source: Ambulatory Visit | Attending: Family Medicine | Admitting: Family Medicine

## 2020-01-05 ENCOUNTER — Other Ambulatory Visit: Payer: Self-pay

## 2020-01-05 DIAGNOSIS — Z1231 Encounter for screening mammogram for malignant neoplasm of breast: Secondary | ICD-10-CM | POA: Diagnosis present

## 2020-05-04 ENCOUNTER — Other Ambulatory Visit: Payer: Self-pay

## 2020-05-04 DIAGNOSIS — Z20822 Contact with and (suspected) exposure to covid-19: Secondary | ICD-10-CM

## 2020-05-07 LAB — NOVEL CORONAVIRUS, NAA: SARS-CoV-2, NAA: NOT DETECTED

## 2020-06-16 ENCOUNTER — Other Ambulatory Visit: Payer: Self-pay | Admitting: Gastroenterology

## 2020-06-16 DIAGNOSIS — R194 Change in bowel habit: Secondary | ICD-10-CM

## 2020-06-16 DIAGNOSIS — R1031 Right lower quadrant pain: Secondary | ICD-10-CM

## 2020-06-30 ENCOUNTER — Other Ambulatory Visit: Payer: Self-pay

## 2020-06-30 ENCOUNTER — Ambulatory Visit
Admission: RE | Admit: 2020-06-30 | Discharge: 2020-06-30 | Disposition: A | Discharge status: Home / Self Care | Payer: BC Managed Care – PPO | Source: Ambulatory Visit | Attending: Gastroenterology | Admitting: Gastroenterology

## 2020-06-30 DIAGNOSIS — R194 Change in bowel habit: Secondary | ICD-10-CM | POA: Insufficient documentation

## 2020-06-30 DIAGNOSIS — R1031 Right lower quadrant pain: Secondary | ICD-10-CM | POA: Insufficient documentation

## 2020-06-30 LAB — POCT I-STAT CREATININE: Creatinine, Ser: 0.6 mg/dL (ref 0.44–1.00)

## 2020-06-30 MED ORDER — IOHEXOL 300 MG/ML  SOLN
100.0000 mL | Freq: Once | INTRAMUSCULAR | Status: AC | PRN
  Administered 2020-06-30: 100 mL via INTRAVENOUS

## 2020-09-06 ENCOUNTER — Other Ambulatory Visit: Payer: Self-pay

## 2020-09-06 DIAGNOSIS — Z0283 Encounter for blood-alcohol and blood-drug test: Secondary | ICD-10-CM

## 2020-09-06 NOTE — Progress Notes (Signed)
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  [facility] Acct #:  [id number] [facility] Specimen #:  [id number]  AMD

## 2021-01-24 ENCOUNTER — Other Ambulatory Visit: Payer: Self-pay | Admitting: Family Medicine

## 2021-01-24 DIAGNOSIS — Z1231 Encounter for screening mammogram for malignant neoplasm of breast: Secondary | ICD-10-CM

## 2021-01-26 ENCOUNTER — Other Ambulatory Visit: Payer: Self-pay

## 2021-01-26 ENCOUNTER — Ambulatory Visit
Admission: RE | Admit: 2021-01-26 | Discharge: 2021-01-26 | Disposition: A | Discharge status: Home / Self Care | Payer: BC Managed Care – PPO | Source: Ambulatory Visit | Attending: Family Medicine | Admitting: Family Medicine

## 2021-01-26 DIAGNOSIS — Z1231 Encounter for screening mammogram for malignant neoplasm of breast: Secondary | ICD-10-CM | POA: Diagnosis not present

## 2021-01-28 DIAGNOSIS — Z23 Encounter for immunization: Secondary | ICD-10-CM | POA: Diagnosis not present

## 2021-04-05 IMAGING — CT CT ABD-PELV W/ CM
2 of 5 series · 16 of 46 positions shown, 18 images · IV contrast (omnipaque)
Comparison: None.

CLINICAL DATA: Intermittent right lower quadrant pain and pelvic
pain. Constipation.

EXAM:
CT ABDOMEN AND PELVIS WITH CONTRAST
TECHNIQUE: Multidetector CT imaging of the abdomen and pelvis was performed
using the standard protocol following bolus administration of
intravenous contrast.
CONTRAST:  100mL OMNIPAQUE IOHEXOL 300 MG/ML  SOLN

[Series 2: abd pelvis 5.00 · axial · 0.65mm/px · z∈[-1487,-1087]mm · 13 of 92 slices shown, 15 images]
[im 6/92  soft-tissue]
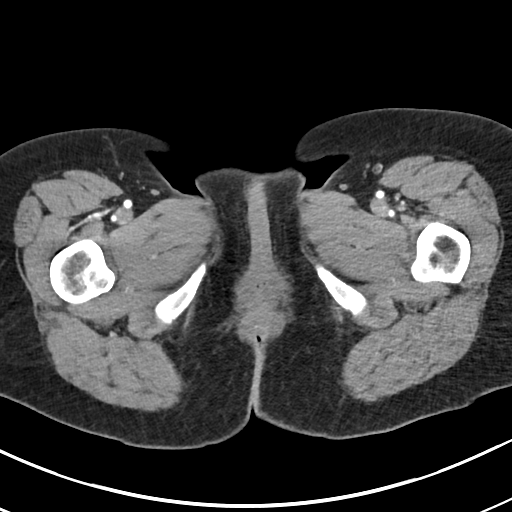
[im 6/92  bone]
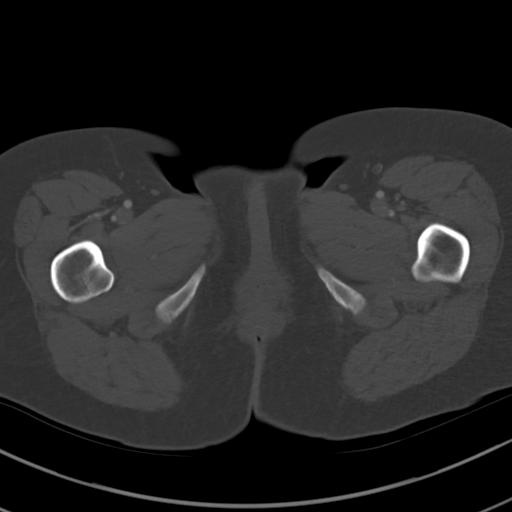
[im 11/92  soft-tissue]
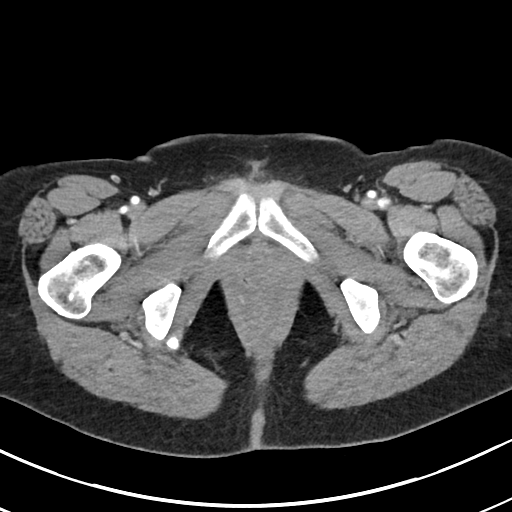
[im 21/92  soft-tissue]
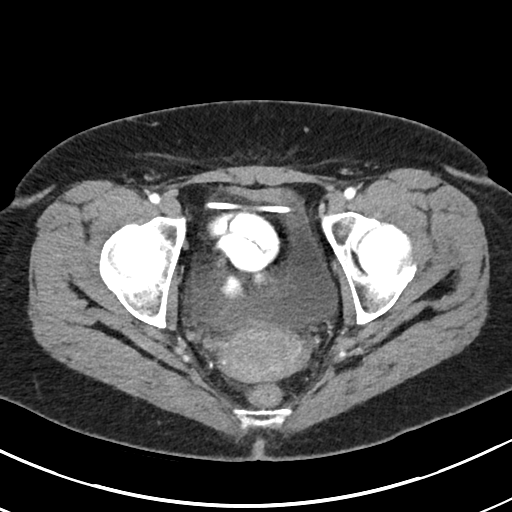
[im 26/92  soft-tissue]
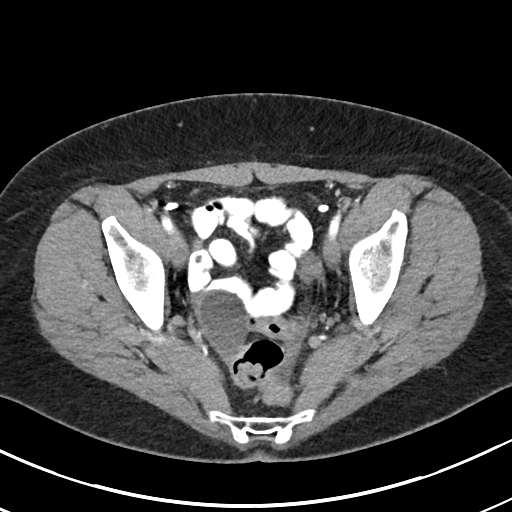
[im 31/92  soft-tissue]
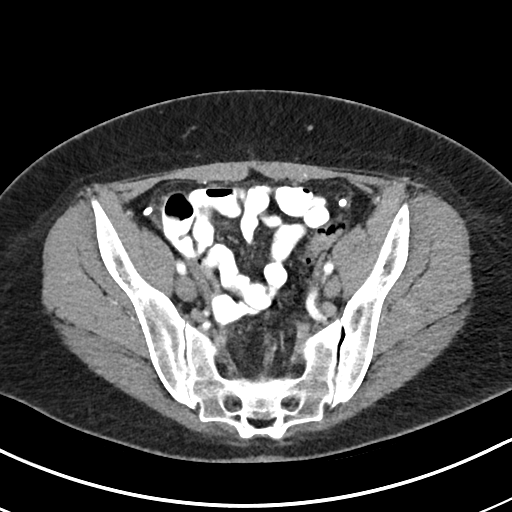
[im 41/92  soft-tissue]
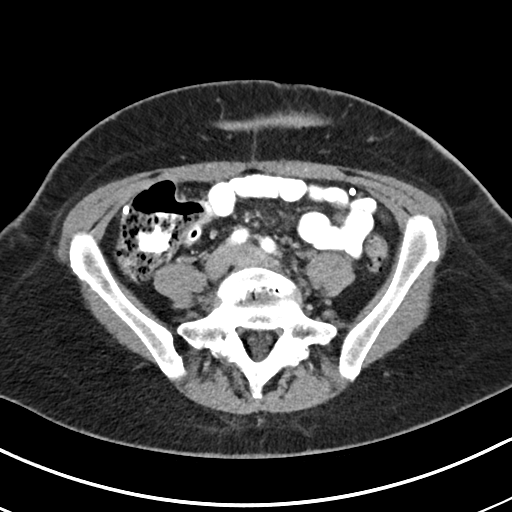
[im 46/92  soft-tissue]
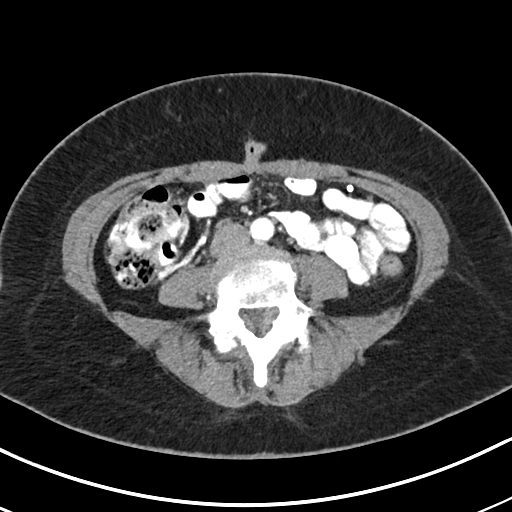
[im 51/92  soft-tissue]
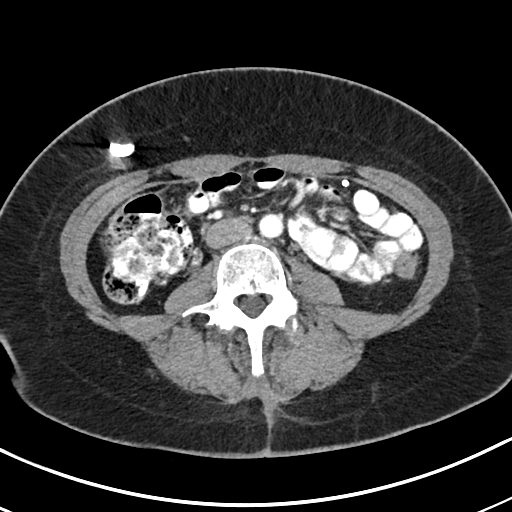
[im 61/92  soft-tissue]
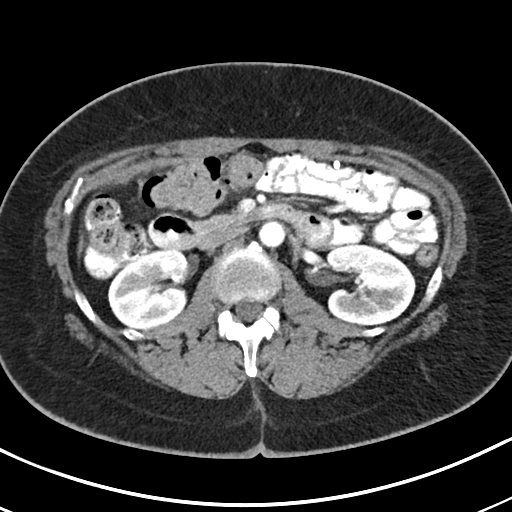
[im 61/92  bone]
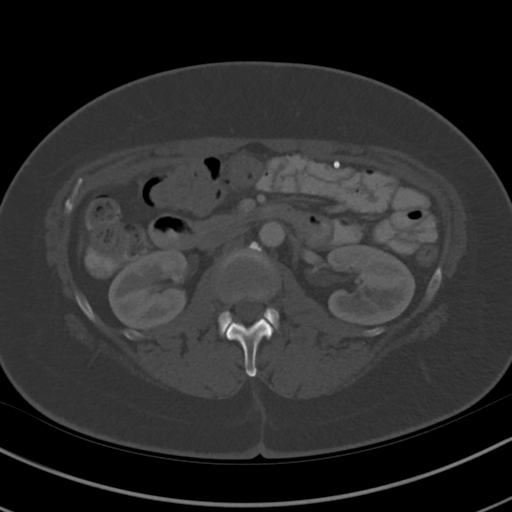
[im 66/92  soft-tissue]
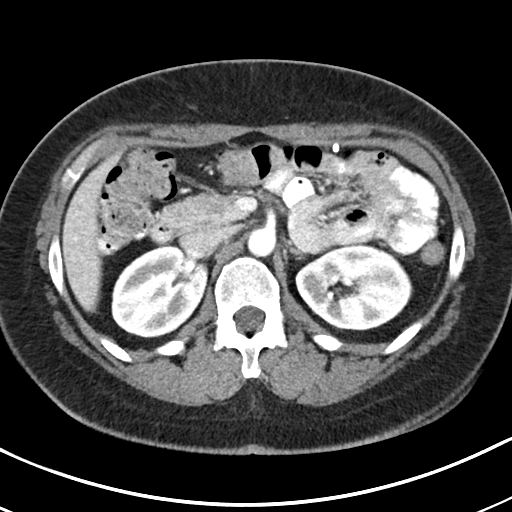
[im 71/92  soft-tissue]
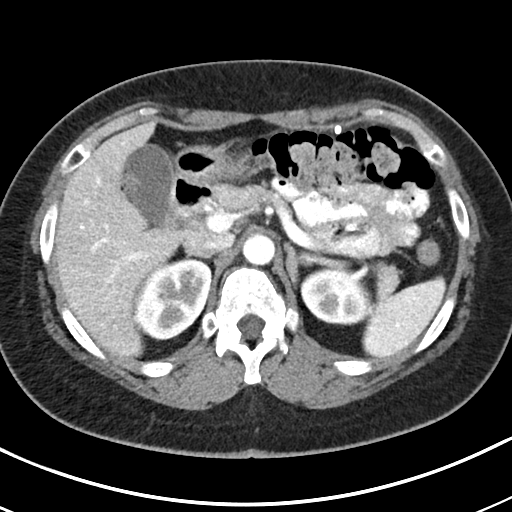
[im 81/92  soft-tissue]
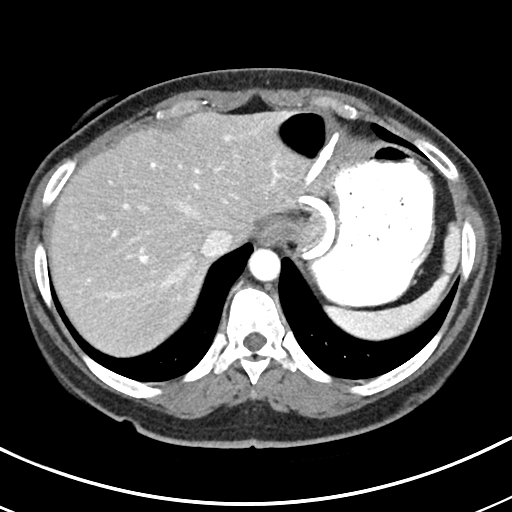
[im 86/92  soft-tissue]
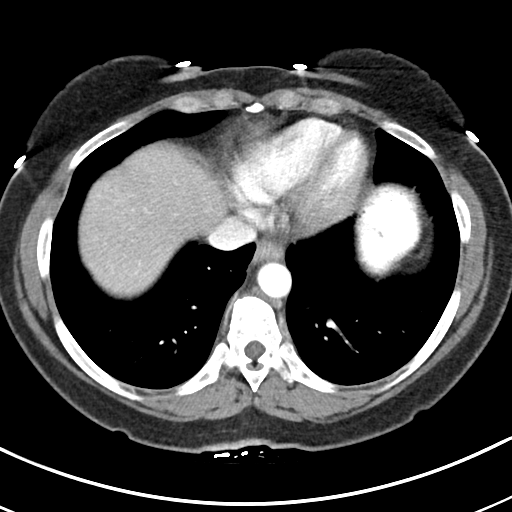

[Series 4: coronals abd pelvis 2.00 cor · coronal · 0.65mm/px · 3 of 132 slices shown]
[im 44/132  soft-tissue]
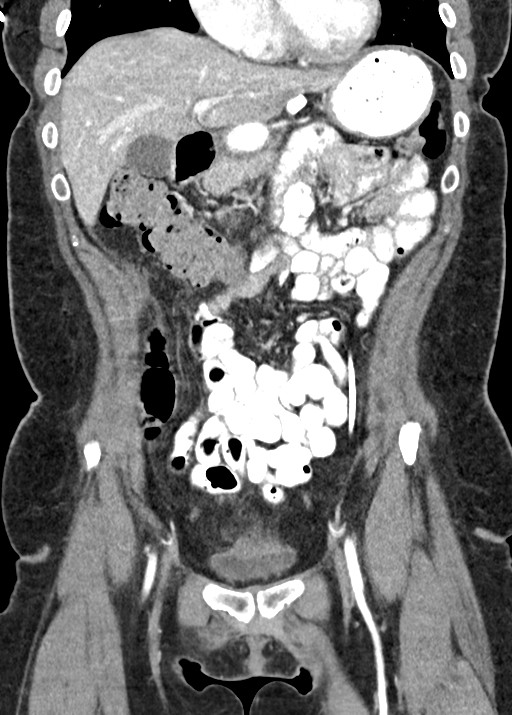
[im 59/132  soft-tissue]
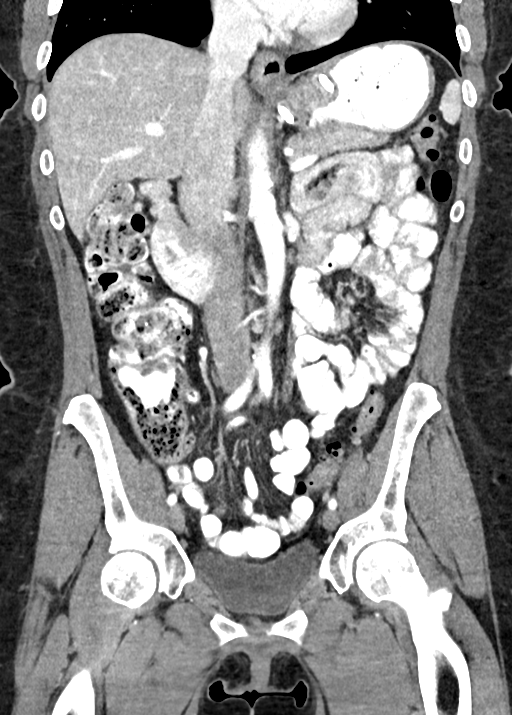
[im 73/132  soft-tissue]
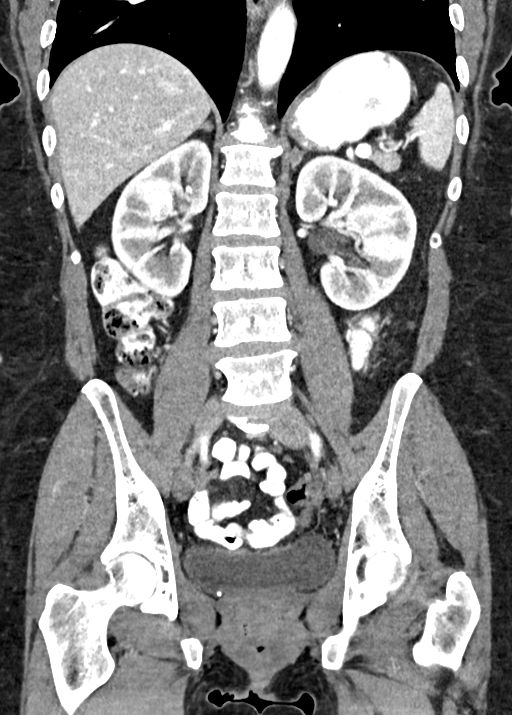

[16 of 46 positions shown; findings below may reference images not displayed]

FINDINGS: Lower Chest: No acute findings.

Hepatobiliary: No hepatic masses identified. Gallbladder is
unremarkable. No evidence of biliary ductal dilatation.

Pancreas:  No mass or inflammatory changes.

Spleen: Within normal limits in size and appearance.

Adrenals/Urinary Tract: No masses identified. No evidence of
ureteral calculi or hydronephrosis.

Stomach/Bowel: Gastric lap band is seen in expected position,
however the injection port is disconnected from the tubing. No
evidence of obstruction, inflammatory process or abnormal fluid
collections. Normal appendix visualized.

Vascular/Lymphatic: No pathologically enlarged lymph nodes. No
abdominal aortic aneurysm.

Reproductive: Retroverted uterus. Simple cyst is seen in the right
adnexal region measuring 4.0 x 3.1 cm. Tiny amount of free fluid is
also noted.

Other:  None.

Musculoskeletal:  No suspicious bone lesions identified.
IMPRESSION: 4.0 cm benign-appearing right ovarian cyst and tiny amount of free
fluid, most likely physiologic in a reproductive age female. No
follow-up imaging recommended. Note: This recommendation does not
apply to premenarchal patients and to those with increased risk
(genetic, family history, elevated tumor markers or other high-risk
factors) of ovarian cancer. Reference: JACR [DATE]):[id number]

Gastric lap band in expected position, however the injection port is
disconnected from the tubing.

## 2021-05-31 ENCOUNTER — Other Ambulatory Visit: Payer: Self-pay

## 2021-05-31 ENCOUNTER — Encounter: Payer: Self-pay | Admitting: Emergency Medicine

## 2021-05-31 ENCOUNTER — Emergency Department: Payer: BC Managed Care – PPO

## 2021-05-31 ENCOUNTER — Emergency Department
Admission: EM | Admit: 2021-05-31 | Discharge: 2021-05-31 | Disposition: A | Discharge status: Home / Self Care | Payer: BC Managed Care – PPO | Attending: Emergency Medicine | Admitting: Emergency Medicine

## 2021-05-31 DIAGNOSIS — M79605 Pain in left leg: Secondary | ICD-10-CM | POA: Diagnosis not present

## 2021-05-31 DIAGNOSIS — I10 Essential (primary) hypertension: Secondary | ICD-10-CM | POA: Diagnosis not present

## 2021-05-31 DIAGNOSIS — M545 Low back pain, unspecified: Secondary | ICD-10-CM | POA: Diagnosis present

## 2021-05-31 DIAGNOSIS — M5442 Lumbago with sciatica, left side: Secondary | ICD-10-CM | POA: Insufficient documentation

## 2021-05-31 DIAGNOSIS — M5432 Sciatica, left side: Secondary | ICD-10-CM

## 2021-05-31 MED ORDER — PREDNISONE 10 MG PO TABS: 10.0000 mg | ORAL_TABLET | Freq: Every day | ORAL | 0 refills | Status: DC

## 2021-05-31 MED ORDER — HYDROCODONE-ACETAMINOPHEN 5-325 MG PO TABS
1.0000 | ORAL_TABLET | ORAL | 0 refills | Status: DC | PRN
Start: 2021-05-31 — End: 2022-05-02

## 2021-05-31 NOTE — ED Provider Notes (Signed)
[facility] Provider Note    Event Date/Time   First MD Initiated Contact with Patient 05/31/21 1314     (approximate)  History   Chief Complaint: Numbness and Leg Pain  HPI  Sh[place] Padilla is a 51 y.o. female with a past medical history of hypertension, sciatica, presents to the emergency department for left leg pain.  According to the patient she has a history of lower back pain and history of sciatica.  Requiring a lower back surgery in 2020 for right-sided sciatica and now is experiencing pain down her left leg.  Patient states the pain starts in her back and goes down into her thigh.  States she has been experiencing some numbness as well in the distal leg as well as some mild weakness in the leg.  Denies any bladder or bowel incontinence.  Patient states she saw her doctor put her on several days of prednisone and pain medications but that did not help so she came to the emergency department for evaluation.  Physical Exam   Triage Vital Signs: ED Triage Vitals  Enc Vitals Group     BP 05/31/21 1253 (!) 155/89     Pulse Rate 05/31/21 1253 68     Resp 05/31/21 1253 18     Temp 05/31/21 1253 98.4 F (36.9 C)     Temp src --      SpO2 05/31/21 1253 100 %     Weight 05/31/21 1240 175 lb 0.7 oz (79.4 kg)     Height 05/31/21 1240 5\' 7"  (1.702 m)     Head Circumference --      Peak Flow --      Pain Score 05/31/21 1240 10     Pain Loc --      Pain Edu? --      Excl. in GC? --     Most recent vital signs: Vitals:   05/31/21 1253  BP: (!) 155/89  Pulse: 68  Resp: 18  Temp: 98.4 F (36.9 C)  SpO2: 100%    General: Awake, no distress.  CV:  Good peripheral perfusion.  Regular rate and rhythm  Resp:  Normal effort.  Equal breath sounds bilaterally.  Abd:  No distention.  Soft, nontender.  No rebound or guarding.   ED Results / Procedures / Treatments   RADIOLOGY  07/29/21 negative   MEDICATIONS ORDERED IN ED: Medications - No data  to display   IMPRESSION / MDM / ASSESSMENT AND PLAN / ED COURSE  I reviewed the triage vital signs and the nursing notes.  Patient presents emergency department for back pain radiating down the left leg.  Patient denies any incontinence.  Patient states she has felt that the left leg is more weak recently.  Patient has a history of sciatica with right lower extremity symptoms in the past requiring her surgery in 2020.  Patient states symptoms are not to that level but she was told if they are not better in several days she is to come to the emergency department for evaluation.  I had a long discussion with the patient regarding back pain and sciatica and typically will take several weeks for symptoms to resolve.  Given the patient's description of weakness in the left leg I recommended proceeding with an MRI.  Patient states she had an MRI several years ago and does not wish to repeat an MRI at this time.  She states she was more concerned that she could possibly have  a blood clot.  No sign of blood clot on exam.  No cords palpable, no edema.  Given her concerns and discomfort we will obtain an ultrasound to rule out a blood clot/DVT.  Patient does not wish to proceed with MRI at this time.  If ultrasound is negative we will place the patient on a longer prednisone taper, pain medication have her follow-up with [facility] who she followed up with during her last episode of sciatica.  Patient is agreeable with this plan.  I reviewed the patient's PCP visit from 05/23/2021 with the patient was seen for left-sided low back pain with left-sided sciatica.  Had x-rays performed, was placed on 5 days of prednisone, muscle relaxer and Norco.  [place] negative  FINAL CLINICAL IMPRESSION(S) / ED DIAGNOSES   Back pain Sciatica  Rx / DC Orders   Prednisone taper Norco  Note:  This document was prepared using Dragon voice recognition software and may include unintentional dictation errors.    Minna Antis, MD 05/31/21 1529

## 2021-05-31 NOTE — ED Notes (Signed)
Pt assisted to room from bathroom. Urine sample obtained and tubed to lab at this time.

## 2021-05-31 NOTE — Progress Notes (Signed)
Discharge instructions given to patient. Verbalized understanding. No acute distress at this time. Patient will be transporting herself home.

## 2021-05-31 NOTE — ED Triage Notes (Signed)
Pt comes into the ED via [facility] clinic for left leg numbness and pain that started last week.  Pt was diagnosed with sciatica and given muscle relaxers, prednisone, and pain medicine.  Pt has had no relief and is still having problems.  Pt in NAD at this time with even and unlabored respirations.

## 2021-12-09 ENCOUNTER — Other Ambulatory Visit: Payer: Self-pay

## 2021-12-09 NOTE — Progress Notes (Signed)
Completed pre-employment uds. HR, Notified.  Rapid UDS: NEGATIVE

## 2021-12-29 ENCOUNTER — Other Ambulatory Visit: Payer: Self-pay | Admitting: Family Medicine

## 2021-12-29 DIAGNOSIS — Z1231 Encounter for screening mammogram for malignant neoplasm of breast: Secondary | ICD-10-CM

## 2022-02-01 ENCOUNTER — Ambulatory Visit
Admission: RE | Admit: 2022-02-01 | Discharge: 2022-02-01 | Disposition: A | Discharge status: Home / Self Care | Payer: BC Managed Care – PPO | Source: Ambulatory Visit | Attending: Family Medicine | Admitting: Family Medicine

## 2022-02-01 DIAGNOSIS — Z1231 Encounter for screening mammogram for malignant neoplasm of breast: Secondary | ICD-10-CM | POA: Diagnosis present

## 2022-03-06 IMAGING — US US EXTREM LOW VENOUS*L*
1 series · 13 of 24 positions shown · non-contrast
Comparison: None.

CLINICAL DATA: Left lower extremity pain/swelling



[Series 1: us venous img lower uni left (dvt) · portal-venous · 13 of 33 slices shown]
[im 1/33]
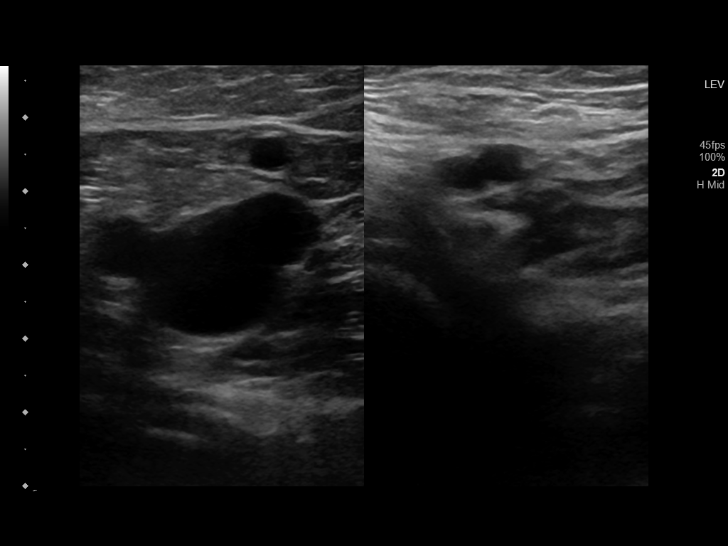
[im 3/33]
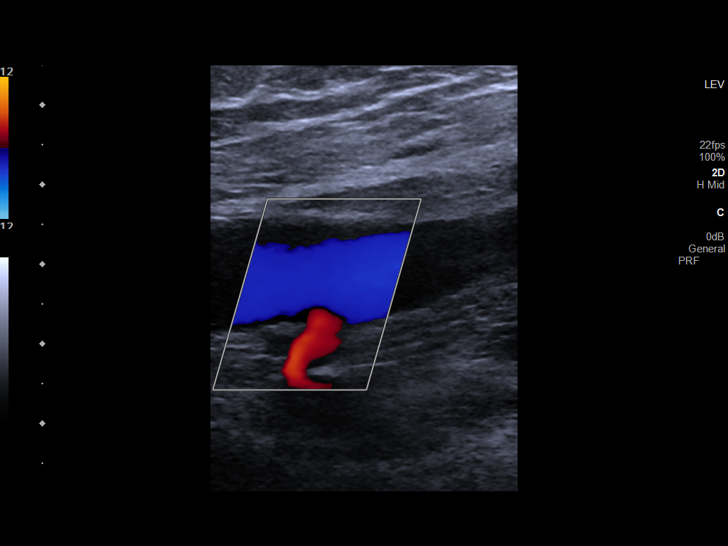
[im 6/33]
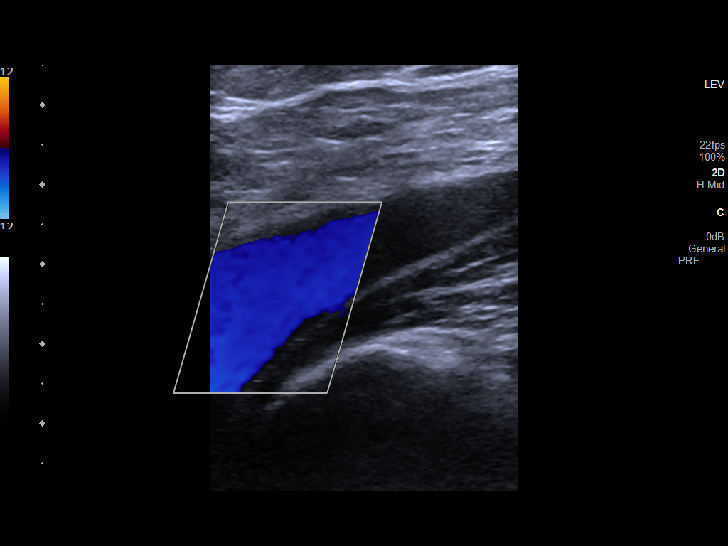
[im 9/33]
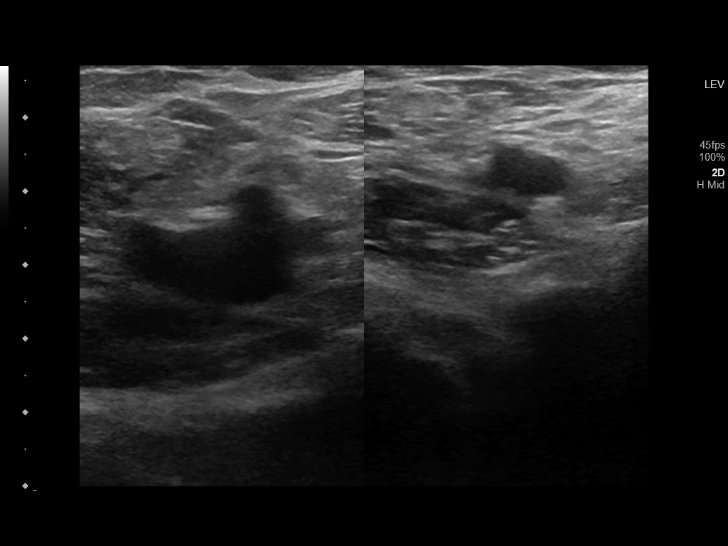
[im 12/33]
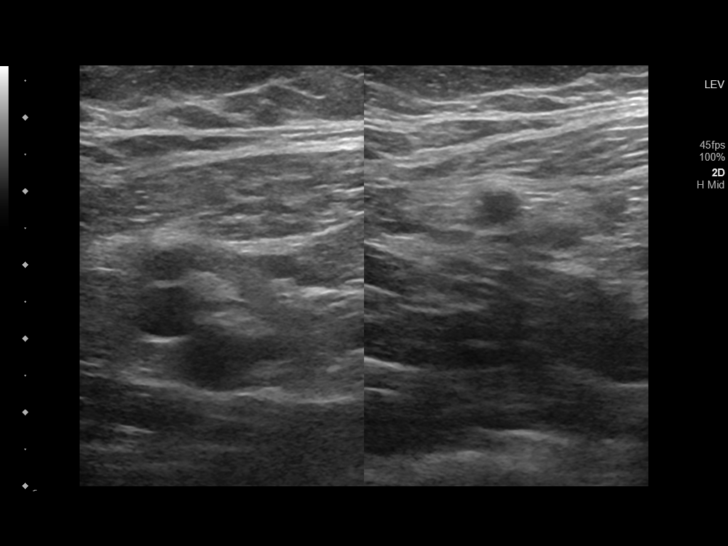
[im 14/33]
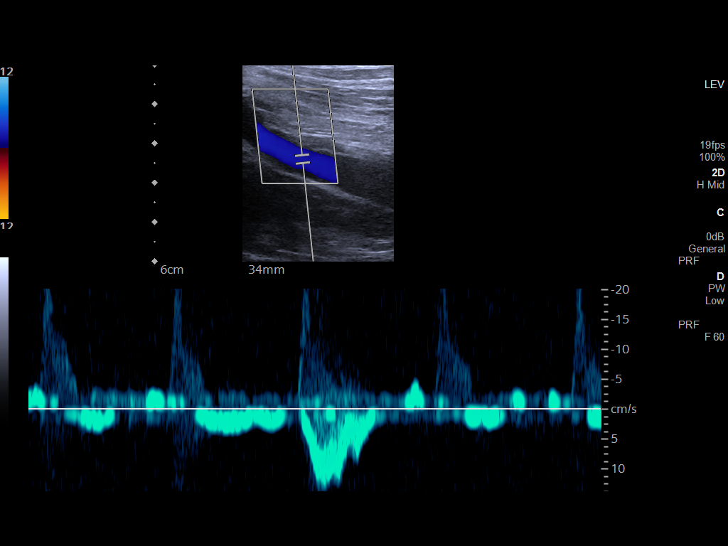
[im 17/33]
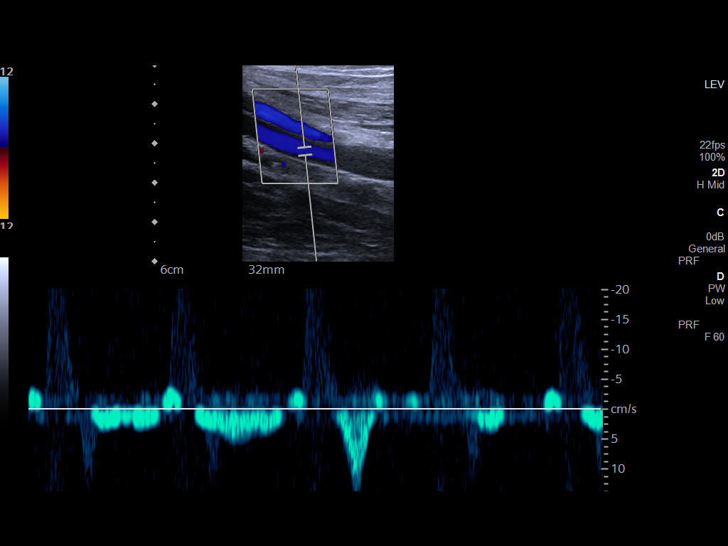
[im 19/33]
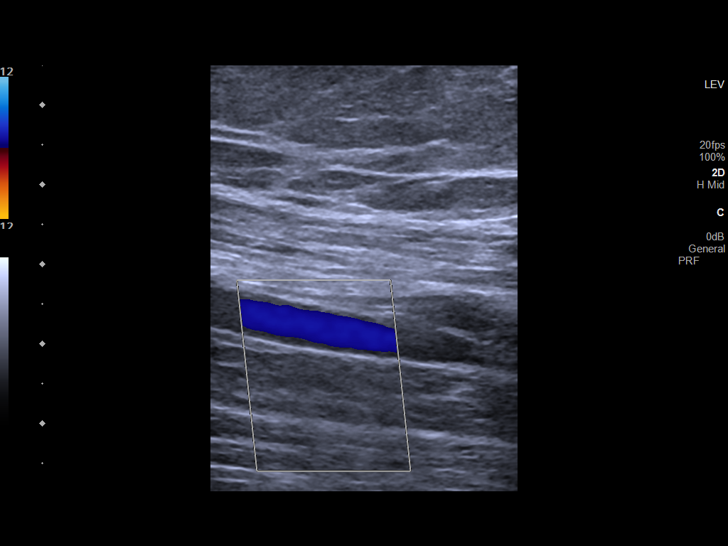
[im 21/33]
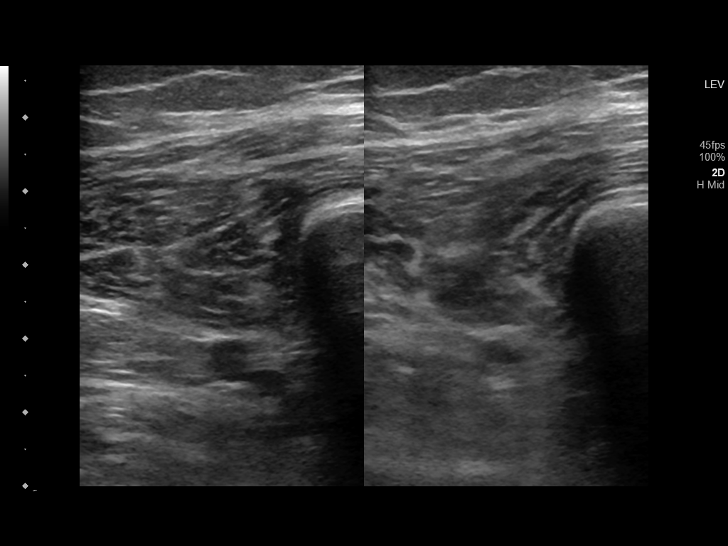
[im 24/33]
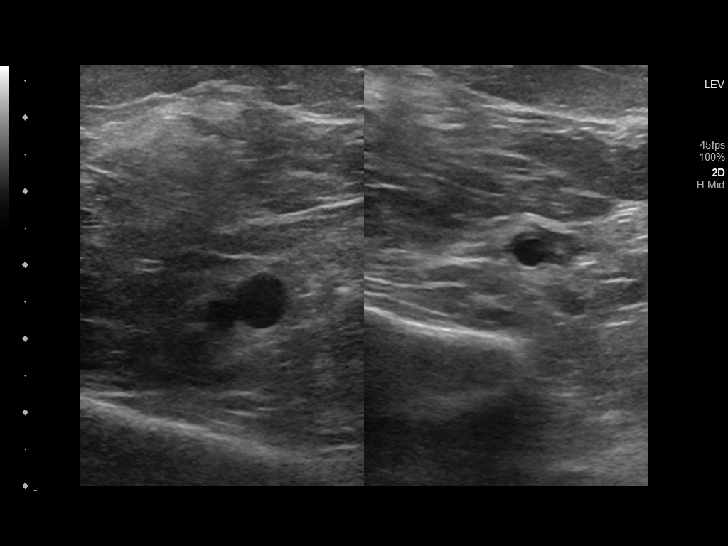
[im 27/33]
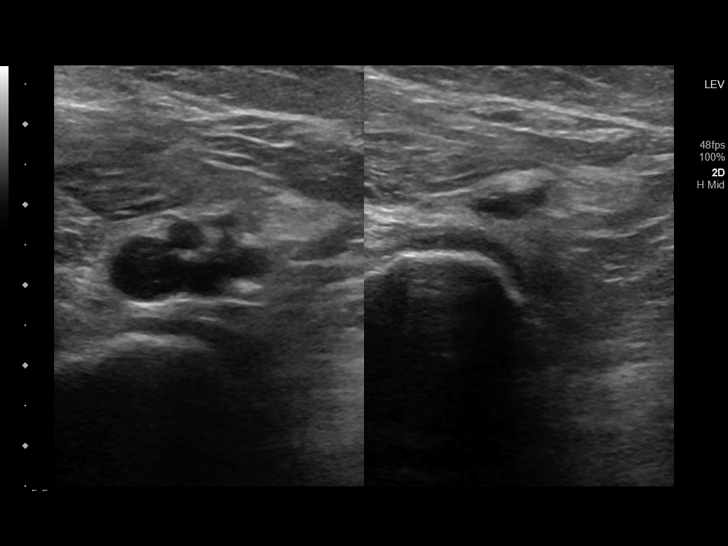
[im 30/33]
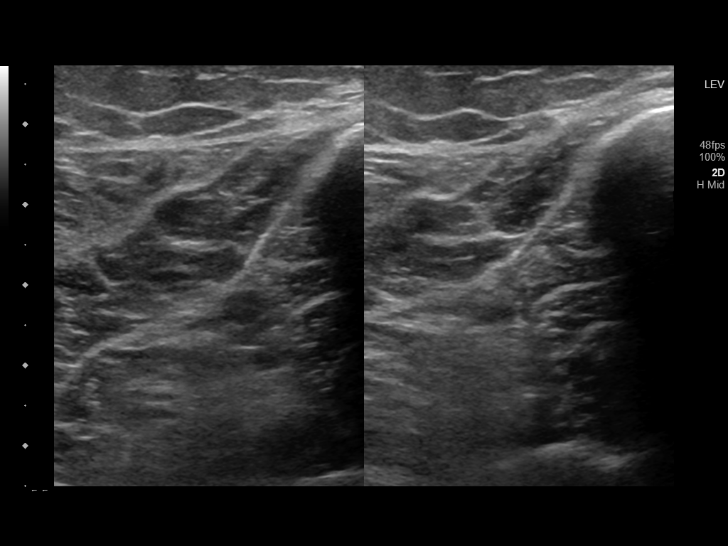
[im 33/33]
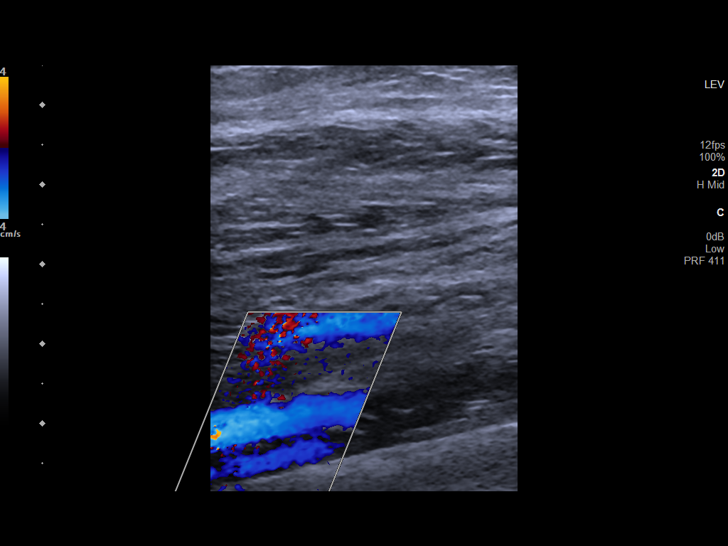

[13 of 24 positions shown; findings below may reference images not displayed]

FINDINGS: Contralateral Common Femoral Vein: Respiratory phasicity is normal
and symmetric with the symptomatic side. No evidence of thrombus.
Normal compressibility.

Common Femoral Vein: No evidence of thrombus. Normal
compressibility, respiratory phasicity and response to augmentation.

Saphenofemoral Junction: No evidence of thrombus. Normal
compressibility and flow on color Doppler imaging.

Profunda Femoral Vein: No evidence of thrombus. Normal
compressibility and flow on color Doppler imaging.

Femoral Vein: No evidence of thrombus. Normal compressibility,
respiratory phasicity and response to augmentation.

Popliteal Vein: No evidence of thrombus. Normal compressibility,
respiratory phasicity and response to augmentation.

Calf Veins: No evidence of thrombus. Normal compressibility and flow
on color Doppler imaging.

Superficial Great Saphenous Vein: No evidence of thrombus. Normal
compressibility.

Venous Reflux:  None.

Other Findings:  None.
IMPRESSION: No evidence of deep venous thrombosis.

## 2022-03-30 ENCOUNTER — Ambulatory Visit (LOCAL_COMMUNITY_HEALTH_CENTER): Payer: BC Managed Care – PPO

## 2022-03-30 DIAGNOSIS — Z719 Counseling, unspecified: Secondary | ICD-10-CM

## 2022-03-30 DIAGNOSIS — Z23 Encounter for immunization: Secondary | ICD-10-CM | POA: Diagnosis not present

## 2022-03-30 NOTE — Progress Notes (Deleted)
covi

## 2022-03-30 NOTE — Progress Notes (Signed)
  Are you feeling sick today? No   Have you ever received a dose of COVID-19 Vaccine? [employer], [place], [place], [place], Other) Yes  If yes, which vaccine and how many doses?    Pfizer 4  Did you bring the vaccination record card or other documentation?  Yes   Do you have a health condition or are undergoing treatment that makes you moderately or severely immunocompromised? This would include, but not be limited to: cancer, HIV, organ transplant, immunosuppressive therapy/high-dose corticosteroids, or moderate/severe primary immunodeficiency.  No  Have you received COVID-19 vaccine before or during hematopoietic cell transplant (HCT) or CAR-T-cell therapies? No  Have you ever had an allergic reaction to: (This would include a severe allergic reaction or a reaction that caused hives, swelling, or respiratory distress, including wheezing.) A component of a COVID-19 vaccine or a previous dose of COVID-19 vaccine? No   Have you ever had an allergic reaction to another vaccine (other thanCOVID-19 vaccine) or an injectable medication? (This would include a severe allergic reaction or a reaction that caused hives, swelling, or respiratory distress, including wheezing.)   No    Do you have a history of any of the following:  Myocarditis or Pericarditis No  Dermal fillers:  No  Multisystem Inflammatory Syndrome (MIS-C or MIS-A)? No  COVID-19 disease within the past 3 months? No  Vaccinated with monkeypox vaccine in the last 4 weeks? No  Pt requested Pfizer Covid vaccine.  Comirnaty 12+ 2023-24 formula administered; tolerated well.  VIS and updated [facility] copy given to pt.  Stayed 15 minutes after vaccine given; no complaints.  Cherlynn Polo, RN

## 2022-04-06 ENCOUNTER — Other Ambulatory Visit: Payer: Self-pay

## 2022-04-06 DIAGNOSIS — Z0283 Encounter for blood-alcohol and blood-drug test: Secondary | ICD-10-CM

## 2022-04-06 NOTE — Progress Notes (Signed)
Presents to [facility] for random DOT Drug Screen & Breath Alcohol Screen.  [facility] Acct #:  [id number] [facility] Specimen #:  [id number]  Breath Alcohol Results = .000  AMD

## 2022-05-02 ENCOUNTER — Encounter: Payer: Self-pay | Admitting: Physician Assistant

## 2022-05-02 ENCOUNTER — Ambulatory Visit: Payer: Self-pay | Admitting: Physician Assistant

## 2022-05-02 VITALS — BP 138/85 | HR 71 | Temp 97.8°F | Resp 14 | Ht 67.0 in | Wt 185.0 lb

## 2022-05-02 DIAGNOSIS — J069 Acute upper respiratory infection, unspecified: Secondary | ICD-10-CM

## 2022-05-02 MED ORDER — METHYLPREDNISOLONE 4 MG PO TBPK: ORAL_TABLET | ORAL | 0 refills | Status: AC

## 2022-05-02 MED ORDER — AZITHROMYCIN 250 MG PO TABS: ORAL_TABLET | ORAL | 0 refills | Status: AC

## 2022-05-02 MED ORDER — BENZONATATE 200 MG PO CAPS: 200.0000 mg | ORAL_CAPSULE | Freq: Three times a day (TID) | ORAL | 0 refills | Status: AC | PRN

## 2022-05-02 MED ORDER — FEXOFENADINE-PSEUDOEPHED ER 60-120 MG PO TB12: 1.0000 | ORAL_TABLET | Freq: Two times a day (BID) | ORAL | 0 refills | Status: AC

## 2022-05-02 NOTE — Progress Notes (Signed)
With runny nose and chest congestion along with wheezing. X3days. Negative for covid and FLU.

## 2022-05-02 NOTE — Progress Notes (Signed)
   Subjective: Cough, runny nose, and wheezing    Patient ID: Sharon Padilla, female    DOB: 05-27-1970, 52 y.o.   MRN: [id number]  HPI Patient complains of cough, rhinorrhea, and wheezing for 3 weeks.  Patient tested negative for COVID-19 and influenza today.  Denies recent travel or known contact with COVID-19.  Patient up-to-date on immunizations for COVID-19 and influenza.   Review of Systems GERD     Objective:   Physical Exam  BP is 138/85, pulse 71, respiration 14, temperature 97.8, patient 90% O2 sat on room air.  Patient weighs 185 pounds and BMI is 29.98. HEENT is remarkable for edematous nasal turbinate with postnasal drainage. Neck is supple followed lymphadenectomy or bruits. Lungs with mild wheezing and increased cough with deep inspirations.  Heart is regular rate and rhythm.      Assessment & Plan: Respiratory infection  Patient given a prescription for Z-Pak, Medrol Dosepak, and Tessalon Perles.  Patient advised to follow-up if no improvement 3 to 5 days.

## 2022-06-14 ENCOUNTER — Other Ambulatory Visit: Payer: Self-pay | Admitting: Obstetrics and Gynecology

## 2022-06-14 DIAGNOSIS — Z1231 Encounter for screening mammogram for malignant neoplasm of breast: Secondary | ICD-10-CM

## 2022-08-01 ENCOUNTER — Other Ambulatory Visit: Payer: Self-pay | Admitting: Sports Medicine

## 2022-08-01 DIAGNOSIS — M79605 Pain in left leg: Secondary | ICD-10-CM

## 2022-08-01 DIAGNOSIS — M47816 Spondylosis without myelopathy or radiculopathy, lumbar region: Secondary | ICD-10-CM

## 2022-08-01 DIAGNOSIS — M4316 Spondylolisthesis, lumbar region: Secondary | ICD-10-CM

## 2022-08-01 DIAGNOSIS — G8929 Other chronic pain: Secondary | ICD-10-CM

## 2022-08-28 ENCOUNTER — Encounter: Payer: Self-pay | Admitting: Sports Medicine

## 2022-09-02 ENCOUNTER — Ambulatory Visit
Admission: RE | Admit: 2022-09-02 | Discharge: 2022-09-02 | Disposition: A | Discharge status: Home / Self Care | Payer: BC Managed Care – PPO | Source: Ambulatory Visit | Attending: Sports Medicine | Admitting: Sports Medicine

## 2022-09-02 DIAGNOSIS — M79605 Pain in left leg: Secondary | ICD-10-CM

## 2022-09-02 DIAGNOSIS — M47816 Spondylosis without myelopathy or radiculopathy, lumbar region: Secondary | ICD-10-CM

## 2022-09-02 DIAGNOSIS — M4316 Spondylolisthesis, lumbar region: Secondary | ICD-10-CM

## 2022-09-02 DIAGNOSIS — G8929 Other chronic pain: Secondary | ICD-10-CM

## 2022-12-07 ENCOUNTER — Other Ambulatory Visit: Payer: Self-pay

## 2022-12-07 DIAGNOSIS — Z0283 Encounter for blood-alcohol and blood-drug test: Secondary | ICD-10-CM

## 2022-12-07 NOTE — Progress Notes (Signed)
Random: UDS & ETOH  UDS pending with [facility]  ETOH CLEARED,

## 2023-02-09 ENCOUNTER — Ambulatory Visit
Admission: RE | Admit: 2023-02-09 | Discharge: 2023-02-09 | Disposition: A | Discharge status: Home / Self Care | Payer: BC Managed Care – PPO | Source: Ambulatory Visit | Attending: Obstetrics and Gynecology | Admitting: Obstetrics and Gynecology

## 2023-02-09 DIAGNOSIS — Z1231 Encounter for screening mammogram for malignant neoplasm of breast: Secondary | ICD-10-CM | POA: Diagnosis present

## 2023-06-20 ENCOUNTER — Other Ambulatory Visit: Payer: Self-pay | Admitting: Obstetrics and Gynecology

## 2023-06-20 DIAGNOSIS — R599 Enlarged lymph nodes, unspecified: Secondary | ICD-10-CM

## 2024-01-21 ENCOUNTER — Other Ambulatory Visit: Payer: Self-pay | Admitting: Obstetrics and Gynecology

## 2024-01-21 DIAGNOSIS — Z1231 Encounter for screening mammogram for malignant neoplasm of breast: Secondary | ICD-10-CM

## 2024-02-11 ENCOUNTER — Ambulatory Visit
Admission: RE | Admit: 2024-02-11 | Discharge: 2024-02-11 | Disposition: A | Discharge status: Home / Self Care | Payer: Self-pay | Source: Ambulatory Visit | Attending: Obstetrics and Gynecology | Admitting: Obstetrics and Gynecology

## 2024-02-11 DIAGNOSIS — Z1231 Encounter for screening mammogram for malignant neoplasm of breast: Secondary | ICD-10-CM | POA: Insufficient documentation
